# Patient Record
Sex: Female | Born: 1960
Health system: Southern US, Community
[De-identification: ages and names within clinical notes are randomized; demographics above are authoritative.]

## PROBLEM LIST (undated history)

## (undated) DIAGNOSIS — M8580 Other specified disorders of bone density and structure, unspecified site: Secondary | ICD-10-CM

## (undated) DIAGNOSIS — D573 Sickle-cell trait: Secondary | ICD-10-CM

## (undated) DIAGNOSIS — E049 Nontoxic goiter, unspecified: Secondary | ICD-10-CM

## (undated) DIAGNOSIS — I10 Essential (primary) hypertension: Secondary | ICD-10-CM

## (undated) HISTORY — DX: Sickle-cell trait: D57.3

## (undated) HISTORY — DX: Other specified disorders of bone density and structure, unspecified site: M85.80

## (undated) HISTORY — DX: Nontoxic goiter, unspecified: E04.9

## (undated) HISTORY — DX: Essential (primary) hypertension: I10

---

## 1998-09-13 HISTORY — PX: APPENDECTOMY: SHX54

## 2005-07-08 ENCOUNTER — Emergency Department (HOSPITAL_COMMUNITY): Admission: EM | Admit: 2005-07-08 | Discharge: 2005-07-08 | Payer: Self-pay | Admitting: Family Medicine

## 2006-01-28 ENCOUNTER — Other Ambulatory Visit: Admission: RE | Admit: 2006-01-28 | Discharge: 2006-01-28 | Payer: Self-pay | Admitting: Family Medicine

## 2006-03-03 ENCOUNTER — Encounter: Admission: RE | Admit: 2006-03-03 | Discharge: 2006-03-03 | Payer: Self-pay | Admitting: Family Medicine

## 2006-03-03 ENCOUNTER — Other Ambulatory Visit: Admission: RE | Admit: 2006-03-03 | Discharge: 2006-03-03 | Payer: Self-pay | Admitting: Diagnostic Radiology

## 2006-03-03 ENCOUNTER — Encounter (INDEPENDENT_AMBULATORY_CARE_PROVIDER_SITE_OTHER): Payer: Self-pay | Admitting: Specialist

## 2007-01-06 ENCOUNTER — Encounter: Admission: RE | Admit: 2007-01-06 | Discharge: 2007-01-06 | Payer: Self-pay | Admitting: Surgery

## 2007-03-08 ENCOUNTER — Other Ambulatory Visit: Admission: RE | Admit: 2007-03-08 | Discharge: 2007-03-08 | Payer: Self-pay | Admitting: Surgery

## 2007-09-01 ENCOUNTER — Emergency Department (HOSPITAL_COMMUNITY): Admission: EM | Admit: 2007-09-01 | Discharge: 2007-09-01 | Payer: Self-pay | Admitting: Emergency Medicine

## 2007-10-24 ENCOUNTER — Encounter: Admission: RE | Admit: 2007-10-24 | Discharge: 2007-10-24 | Payer: Self-pay | Admitting: Surgery

## 2008-04-15 ENCOUNTER — Ambulatory Visit: Admission: RE | Admit: 2008-04-15 | Discharge: 2008-04-15 | Payer: Self-pay | Admitting: Family Medicine

## 2008-07-30 ENCOUNTER — Other Ambulatory Visit: Admission: RE | Admit: 2008-07-30 | Discharge: 2008-07-30 | Payer: Self-pay | Admitting: Family Medicine

## 2008-12-09 ENCOUNTER — Encounter: Admission: RE | Admit: 2008-12-09 | Discharge: 2008-12-09 | Payer: Self-pay | Admitting: Surgery

## 2010-10-04 ENCOUNTER — Encounter: Payer: Self-pay | Admitting: Surgery

## 2011-05-02 ENCOUNTER — Emergency Department (HOSPITAL_COMMUNITY)
Admission: EM | Admit: 2011-05-02 | Discharge: 2011-05-02 | Disposition: A | Discharge status: Home / Self Care | Payer: BC Managed Care – PPO | Attending: Emergency Medicine | Admitting: Emergency Medicine

## 2011-05-02 DIAGNOSIS — R51 Headache: Secondary | ICD-10-CM | POA: Insufficient documentation

## 2011-05-02 DIAGNOSIS — R42 Dizziness and giddiness: Secondary | ICD-10-CM | POA: Insufficient documentation

## 2011-05-02 DIAGNOSIS — I10 Essential (primary) hypertension: Secondary | ICD-10-CM | POA: Insufficient documentation

## 2011-05-02 LAB — CBC
HCT: 33.6 % — ABNORMAL LOW (ref 36.0–46.0)
Hemoglobin: 11.6 g/dL — ABNORMAL LOW (ref 12.0–15.0)
MCHC: 34.5 g/dL (ref 30.0–36.0)
MCV: 89.4 fL (ref 78.0–100.0)
RBC: 3.76 MIL/uL — ABNORMAL LOW (ref 3.87–5.11)
WBC: 5.1 10*3/uL (ref 4.0–10.5)

## 2011-05-02 LAB — URINALYSIS, ROUTINE W REFLEX MICROSCOPIC
Bilirubin Urine: NEGATIVE
Ketones, ur: NEGATIVE mg/dL
Nitrite: NEGATIVE
Protein, ur: NEGATIVE mg/dL
Specific Gravity, Urine: 1.008 (ref 1.005–1.030)

## 2011-05-02 LAB — COMPREHENSIVE METABOLIC PANEL
ALT: 19 U/L (ref 0–35)
Albumin: 4.2 g/dL (ref 3.5–5.2)
BUN: 12 mg/dL (ref 6–23)
CO2: 30 mEq/L (ref 19–32)
Calcium: 10.6 mg/dL — ABNORMAL HIGH (ref 8.4–10.5)
GFR calc Af Amer: >60 mL/min (ref >60)
Glucose, Bld: 80 mg/dL (ref 70–99)
Total Protein: 7.9 g/dL (ref 6.0–8.3)

## 2011-05-02 LAB — DIFFERENTIAL
Basophils Absolute: 0 10*3/uL (ref 0.0–0.1)
Eosinophils Absolute: 0 10*3/uL (ref 0.0–0.7)
Lymphocytes Relative: 39 % (ref 12–46)
Monocytes Absolute: 0.4 10*3/uL (ref 0.1–1.0)

## 2011-05-02 LAB — URINE MICROSCOPIC-ADD ON

## 2011-06-18 LAB — I-STAT 8, (EC8 V) (CONVERTED LAB)
Glucose, Bld: 109 — ABNORMAL HIGH
HCT: 32 — ABNORMAL LOW
Operator id: 285841
Potassium: 3.8
Sodium: 141
TCO2: 29
pCO2, Ven: 53 — ABNORMAL HIGH

## 2011-06-18 LAB — POCT CARDIAC MARKERS: Myoglobin, poc: 53.2

## 2011-06-18 LAB — POCT I-STAT CREATININE
Creatinine, Ser: 1
Operator id: 285841

## 2011-10-28 ENCOUNTER — Encounter: Payer: Self-pay | Admitting: Cardiovascular Disease

## 2011-10-28 ENCOUNTER — Ambulatory Visit (INDEPENDENT_AMBULATORY_CARE_PROVIDER_SITE_OTHER): Payer: BC Managed Care – PPO | Admitting: Cardiovascular Disease

## 2011-10-28 DIAGNOSIS — R079 Chest pain, unspecified: Secondary | ICD-10-CM | POA: Insufficient documentation

## 2011-10-28 NOTE — Progress Notes (Signed)
   History of Present Illness: 51 yo AAF with history of HTN, osteopenia, anemia who is referred today for cardiac evaluation by Dr. Merri Brunette at [facility] for evaluation of chest pain. She tells me that for 4 months, she feels that she has "pressure on her chest". This happens with exertion. It is described as a pressure. No radiation. There is associated SOB but no nausea/diaphoresis. She denies orthopnea, PND, LE edema.   Primary Care Physician: Dr. Azucena Cecil  [facility])   Past Medical History  Diagnosis Date  . Hypertension   . Osteopenia   . Goiter   . Sickle cell trait     Past Surgical History  Procedure Date  . Appendectomy 2000    Current Outpatient Prescriptions  Medication Sig Dispense Refill  . amLODipine (NORVASC) 2.5 MG tablet Take 2.5 mg by mouth daily.      . hydrochlorothiazide (HYDRODIURIL) 25 MG tablet Take 25 mg by mouth daily.        No Known Allergies  History   Social History  . Marital Status: Married    Spouse Name: N/A    Number of Children: 4  . Years of Education: N/A   Occupational History  . 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 Smoking status: Never Smoker   . Smokeless tobacco: Not on file  . Alcohol Use: No  . Drug Use: No  . Sexually Active: Not on file   Other Topics Concern  . Not on file   Social History Narrative   Married 4 children    Family History  Problem Relation Age of Onset  . Diabetes Mother   . Pulmonary embolism Father     After traumatic injury and fracture    Review of Systems:  As stated in the HPI and otherwise negative.   BP 118/73  Pulse 73  Ht 5\' 6"  (1.676 m)  Wt 164 lb (74.39 kg)  BMI 26.47 kg/m2  Physical Examination: General: Well developed, well nourished, NAD HEENT: OP clear, mucus membranes moist SKIN: warm, dry. No rashes. Neuro: No focal deficits Musculoskeletal: Muscle strength 5/5 all ext Psychiatric: Mood and affect  normal Neck: No JVD, no carotid bruits, no thyromegaly, no lymphadenopathy. Lungs:Clear bilaterally, no wheezes, rhonci, crackles Cardiovascular: Regular rate and rhythm. No murmurs, gallops or rubs. Abdomen:Soft. Bowel sounds present. Non-tender.  Extremities: No lower extremity edema. Pulses are 2 + in the bilateral DP/PT.  EKG: Sinus brady, rate 56 bpm. Otherwise normal ekg.

## 2011-10-28 NOTE — Assessment & Plan Note (Signed)
Her chest pain is exertional. It is relieved with rest. Her risk factor for CAD is HTN. This may be stress related. Does not appear to be GI related. Will arrange exercise stress myoview to exclude ischemia and echocardiogram to assess her LVEF and exclude structural heart disease.

## 2011-10-28 NOTE — Patient Instructions (Signed)
Your physician recommends that you schedule a follow-up appointment in: 3 weeks Your physician has requested that you have an echocardiogram. Echocardiography is a painless test that uses sound waves to create images of your heart. It provides your doctor with information about the size and shape of your heart and how well your heart's chambers and valves are working. This procedure takes approximately one hour. There are no restrictions for this procedure.  Your physician has requested that you have en exercise stress myoview. For further information please visit [email or web address]. Please follow instruction sheet, as given.   

## 2011-11-09 ENCOUNTER — Ambulatory Visit (HOSPITAL_COMMUNITY): Payer: BC Managed Care – PPO | Attending: Cardiovascular Disease | Admitting: Radiology

## 2011-11-09 DIAGNOSIS — R079 Chest pain, unspecified: Secondary | ICD-10-CM

## 2011-11-09 DIAGNOSIS — R002 Palpitations: Secondary | ICD-10-CM | POA: Insufficient documentation

## 2011-11-09 DIAGNOSIS — R0602 Shortness of breath: Secondary | ICD-10-CM | POA: Insufficient documentation

## 2011-11-09 DIAGNOSIS — R000 Tachycardia, unspecified: Secondary | ICD-10-CM | POA: Insufficient documentation

## 2011-11-09 DIAGNOSIS — I10 Essential (primary) hypertension: Secondary | ICD-10-CM | POA: Insufficient documentation

## 2011-11-09 DIAGNOSIS — R0789 Other chest pain: Secondary | ICD-10-CM | POA: Insufficient documentation

## 2011-11-09 MED ORDER — TECHNETIUM TC 99M TETROFOSMIN IV KIT
10.0000 | PACK | Freq: Once | INTRAVENOUS | Status: AC | PRN
  Administered 2011-11-09: 10 via INTRAVENOUS

## 2011-11-09 MED ORDER — TECHNETIUM TC 99M TETROFOSMIN IV KIT
30.0000 | PACK | Freq: Once | INTRAVENOUS | Status: AC | PRN
  Administered 2011-11-09: 30 via INTRAVENOUS

## 2011-11-09 NOTE — Progress Notes (Signed)
[facility] SITE 3 NUCLEAR MED [address] [place] [address] [phone]  Cardiology Nuclear Med Study  Megan Bates is a 51 y.o. female [id number] February 23, 1961   Nuclear Med Background Indication for Stress Test:  Evaluation for Ischemia History:  No previous documented CAD Cardiac Risk Factors: Hypertension  Symptoms:  Chest Pain/Pressure with and without Exertion (last episode of chest discomfort was Friday, 11/05/11), Palpitations, Rapid HR and SOB with Chest Pain   Nuclear Pre-Procedure Caffeine/Decaff Intake:  None> 12 hrs  NPO After: 11:00pm   Lungs:  Clear. IV 0.9% NS with Angio Cath:  20g  IV Site: R Antecubital x 1,tolerated well IV Started by:  Irean Hong, RN  Chest Size (in):  38 Cup Size: C  Height: 5\' 6"  (1.676 m)  Weight:  165 lb (74.844 kg)  BMI:  Body mass index is 26.63 kg/(m^2). Tech Comments:  No medications taken today, per patient.    Nuclear Med Study 1 or 2 day study: 1 day  Stress Test Type:  Stress  Reading MD: Charlton Haws, MD  Order Authorizing Provider:  Verne Carrow, MD  Resting Radionuclide: Technetium 33m Tetrofosmin  Resting Radionuclide Dose: 11.0 mCi   Stress Radionuclide:  Technetium 74m Tetrofosmin  Stress Radionuclide Dose: 33.0 mCi           Stress Protocol Rest HR: 60 Stress HR: 150  Rest BP: Sitting:129/77  Standing:1215/79 Stress BP: 199/92  Exercise Time (min): 8:00 METS: 8.9   Predicted Max HR: 170 bpm % Max HR: 88.24 bpm Rate Pressure Product: 21308   Dose of Adenosine (mg):  n/a Dose of Lexiscan: n/a mg  Dose of Atropine (mg): n/a Dose of Dobutamine: n/a mcg/kg/min (at max HR)  Stress Test Technologist: Smiley Houseman, CMA-N  Nuclear Technologist:  Domenic Polite, CNMT     Rest Procedure:  Myocardial perfusion imaging was performed at rest 45 minutes following the intravenous administration of Technetium 54m Tetrofosmin.  Rest ECG: No acute changes.  Stress Procedure:  The patient  exercised for eight minutes on the treadmill utilizing the Bruce protocol.  The patient stopped due to fatigue and denied any chest pain.  There were no diagnostic ST-T wave changes.  Technetium 107m Tetrofosmin was injected at peak exercise and myocardial perfusion imaging was performed after a brief delay.  Stress ECG: No significant change from baseline ECG and Peak stress ECG with artifact  QPS Raw Data Images:  Normal; no motion artifact; normal heart/lung ratio. Stress Images:  Normal homogeneous uptake in all areas of the myocardium. Rest Images:  Normal homogeneous uptake in all areas of the myocardium. Subtraction (SDS):  Normal Transient Ischemic Dilatation (Normal <1.22):  1.14 Lung/Heart Ratio (Normal <0.45):  0.31  Quantitative Gated Spect Images QGS EDV:  61 ml QGS ESV:  20 ml QGS cine images:  NL LV Function; NL Wall Motion QGS EF: 67%  Impression Exercise Capacity:  Fair exercise capacity. BP Response:  Normal blood pressure response. Clinical Symptoms:  There is dyspnea. ECG Impression:  No obvious ECG changes but lots of artifact on peak stress ECG Comparison with Prior Nuclear Study: No previous nuclear study performed  Overall Impression:  Normal stress nuclear study.    Charlton Haws

## 2011-11-11 ENCOUNTER — Other Ambulatory Visit: Payer: Self-pay

## 2011-11-11 ENCOUNTER — Ambulatory Visit (HOSPITAL_COMMUNITY): Payer: BC Managed Care – PPO | Attending: Cardiology

## 2011-11-11 ENCOUNTER — Other Ambulatory Visit (HOSPITAL_COMMUNITY): Payer: Self-pay | Admitting: Cardiovascular Disease

## 2011-11-11 DIAGNOSIS — R079 Chest pain, unspecified: Secondary | ICD-10-CM | POA: Insufficient documentation

## 2011-11-11 DIAGNOSIS — R072 Precordial pain: Secondary | ICD-10-CM

## 2011-11-11 DIAGNOSIS — R0609 Other forms of dyspnea: Secondary | ICD-10-CM | POA: Insufficient documentation

## 2011-11-11 DIAGNOSIS — R0989 Other specified symptoms and signs involving the circulatory and respiratory systems: Secondary | ICD-10-CM | POA: Insufficient documentation

## 2011-11-30 ENCOUNTER — Ambulatory Visit (INDEPENDENT_AMBULATORY_CARE_PROVIDER_SITE_OTHER): Payer: BC Managed Care – PPO | Admitting: Cardiovascular Disease

## 2011-11-30 ENCOUNTER — Encounter: Payer: Self-pay | Admitting: Cardiovascular Disease

## 2011-11-30 DIAGNOSIS — R079 Chest pain, unspecified: Secondary | ICD-10-CM

## 2011-11-30 NOTE — Patient Instructions (Signed)
Your physician recommends that you schedule a follow-up appointment as needed with Dr. McAlhany   

## 2011-11-30 NOTE — Assessment & Plan Note (Signed)
Her chest pain is resolved. Stress myoview is normal with no evidence of ischemia. Echo normal. This is most likely non-cardiac chest pain. No further cardiac workup is needed at this time.

## 2011-11-30 NOTE — Progress Notes (Signed)
History of Present Illness: 51 yo AAF with history of HTN, osteopenia, anemia who is here today for cardiac follow up. I saw her several weeks ago for  evaluation of chest pain. She told me that for 4 months, she feels that she has "pressure on her chest". This happens with exertion. It is described as a pressure. No radiation. There is associated SOB but no nausea/diaphoresis. She denies orthopnea, PND, LE edema. I arranged a stress myoview and echo at the last visit. Her stress test was normal. Her echo was normal. She is feeling better. Chest pain has resolved. No other complaints.   Primary Care Physician: Dr. Azucena Cecil, [facility])    Past Medical History  Diagnosis Date  . Hypertension   . Osteopenia   . Goiter   . Sickle cell trait     Past Surgical History  Procedure Date  . Appendectomy 2000    Current Outpatient Prescriptions  Medication Sig Dispense Refill  . amLODipine (NORVASC) 2.5 MG tablet Take 2.5 mg by mouth daily.      . hydrochlorothiazide (HYDRODIURIL) 25 MG tablet Take 25 mg by mouth daily.        No Known Allergies  History   Social History  . Marital Status: Married    Spouse Name: N/A    Number of Children: 4  . 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 Smoking status: Never Smoker   . Smokeless tobacco: Not on file  . Alcohol Use: No  . Drug Use: No  . Sexually Active: Not on file   Other Topics Concern  . Not on file   Social History Narrative   Married 4 children    Family History  Problem Relation Age of Onset  . Diabetes Mother   . Pulmonary embolism Father     After traumatic injury and fracture    Review of Systems:  As stated in the HPI and otherwise negative.   BP 116/63  Pulse 61  Ht 5\' 5"  (1.651 m)  Wt 164 lb (74.39 kg)  BMI 27.29 kg/m2  Physical Examination: General: Well developed, well nourished, NAD HEENT: OP clear, mucus  membranes moist SKIN: warm, dry. No rashes. Neuro: No focal deficits Musculoskeletal: Muscle strength 5/5 all ext Psychiatric: Mood and affect normal Neck: No JVD, no carotid bruits, no thyromegaly, no lymphadenopathy. Lungs:Clear bilaterally, no wheezes, rhonci, crackles Cardiovascular: Regular rate and rhythm. No murmurs, gallops or rubs. Abdomen:Soft. Bowel sounds present. Non-tender.  Extremities: No lower extremity edema. Pulses are 2 + in the bilateral DP/PT.  Stress Myoview: 11/09/11: Stress Procedure: The patient exercised for eight minutes on the treadmill utilizing the Bruce protocol. The patient stopped due to fatigue and denied any chest pain. There were no diagnostic ST-T wave changes. Technetium 3m Tetrofosmin was injected at peak exercise and myocardial perfusion imaging was performed after a brief delay.  Stress ECG: No significant change from baseline ECG and Peak stress ECG with artifact  QPS  Raw Data Images: Normal; no motion artifact; normal heart/lung ratio.  Stress Images: Normal homogeneous uptake in all areas of the myocardium.  Rest Images: Normal homogeneous uptake in all areas of the myocardium.  Subtraction (SDS): Normal  Transient Ischemic Dilatation (Normal <1.22): 1.14  Lung/Heart Ratio (Normal <0.45): 0.31  Quantitative Gated Spect Images  QGS EDV: 61 ml  QGS ESV: 20 ml  QGS cine images: NL  LV Function; NL Wall Motion  QGS EF: 67%  Impression  Exercise Capacity: Fair exercise capacity.  BP Response: Normal blood pressure response.  Clinical Symptoms: There is dyspnea.  ECG Impression: No obvious ECG changes but lots of artifact on peak stress ECG  Comparison with Prior Nuclear Study: No previous nuclear study performed  Overall Impression: Normal stress nuclear study.  Echo 11/11/11: Left ventricle: The cavity size was normal. Wall thickness was normal. Systolic function was normal. The estimated ejection fraction was in the range of 60% to 65%.  Wall motion was normal; there were no regional wall motion abnormalities. Features are consistent with a pseudonormal left ventricular filling pattern, with concomitant abnormal relaxation and increased filling pressure (grade 2 diastolic dysfunction). - Aortic valve: There was no stenosis. - Mitral valve: No significant regurgitation. - Left atrium: The atrium was mildly dilated. - Right ventricle: The cavity size was normal. Systolic function was normal. - Tricuspid valve: Peak RV-RA gradient: 24mm Hg (S). - Pulmonary arteries: PA peak pressure: 29mm Hg (S). - Inferior vena cava: The vessel was normal in size; the respirophasic diameter changes were in the normal range (= 50%); findings are consistent with normal central venous pressure. Impressions:  - Normal LV size and systolic function, EF 60-65%. Moderate diastolic dysfunction. Normal RV size and systolic function. No significant valvular dysfunction.

## 2013-06-05 ENCOUNTER — Encounter (HOSPITAL_COMMUNITY): Payer: Self-pay | Admitting: Emergency Medicine

## 2013-06-05 ENCOUNTER — Emergency Department (HOSPITAL_COMMUNITY)
Admission: EM | Admit: 2013-06-05 | Discharge: 2013-06-05 | Disposition: A | Discharge status: Home / Self Care | Payer: No Typology Code available for payment source | Attending: Emergency Medicine | Admitting: Emergency Medicine

## 2013-06-05 ENCOUNTER — Emergency Department (HOSPITAL_COMMUNITY): Payer: No Typology Code available for payment source

## 2013-06-05 DIAGNOSIS — Z862 Personal history of diseases of the blood and blood-forming organs and certain disorders involving the immune mechanism: Secondary | ICD-10-CM | POA: Insufficient documentation

## 2013-06-05 DIAGNOSIS — S39012A Strain of muscle, fascia and tendon of lower back, initial encounter: Secondary | ICD-10-CM

## 2013-06-05 DIAGNOSIS — Z8639 Personal history of other endocrine, nutritional and metabolic disease: Secondary | ICD-10-CM | POA: Insufficient documentation

## 2013-06-05 DIAGNOSIS — Y92410 Unspecified street and highway as the place of occurrence of the external cause: Secondary | ICD-10-CM | POA: Insufficient documentation

## 2013-06-05 DIAGNOSIS — I10 Essential (primary) hypertension: Secondary | ICD-10-CM | POA: Insufficient documentation

## 2013-06-05 DIAGNOSIS — Y9389 Activity, other specified: Secondary | ICD-10-CM | POA: Insufficient documentation

## 2013-06-05 DIAGNOSIS — Z79899 Other long term (current) drug therapy: Secondary | ICD-10-CM | POA: Insufficient documentation

## 2013-06-05 DIAGNOSIS — M545 Low back pain, unspecified: Secondary | ICD-10-CM

## 2013-06-05 DIAGNOSIS — S335XXA Sprain of ligaments of lumbar spine, initial encounter: Secondary | ICD-10-CM | POA: Insufficient documentation

## 2013-06-05 DIAGNOSIS — M549 Dorsalgia, unspecified: Secondary | ICD-10-CM

## 2013-06-05 DIAGNOSIS — Z8739 Personal history of other diseases of the musculoskeletal system and connective tissue: Secondary | ICD-10-CM | POA: Insufficient documentation

## 2013-06-05 MED ORDER — METHOCARBAMOL 500 MG PO TABS
500.0000 mg | ORAL_TABLET | Freq: Once | ORAL | Status: DC
  Filled 2013-06-05: qty 1

## 2013-06-05 MED ORDER — METHOCARBAMOL 750 MG PO TABS: 750.0000 mg | ORAL_TABLET | Freq: Four times a day (QID) | ORAL | Status: DC | PRN

## 2013-06-05 MED ORDER — IBUPROFEN 800 MG PO TABS: 800.0000 mg | ORAL_TABLET | Freq: Three times a day (TID) | ORAL | Status: AC | PRN

## 2013-06-05 MED ORDER — IBUPROFEN 800 MG PO TABS
800.0000 mg | ORAL_TABLET | Freq: Once | ORAL | Status: DC
  Filled 2013-06-05: qty 1
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
## 2013-06-05 NOTE — ED Notes (Signed)
Pt refused medications stating she had not eaten since the previous night.
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
## 2013-06-06 NOTE — ED Provider Notes (Signed)
Medical screening examination/treatment/procedure(s) were performed by non-physician practitioner and as supervising physician I was immediately available for consultation/collaboration.  Courtney F Horton, MD 06/06/13 1411 
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
## 2015-09-22 DIAGNOSIS — Z01419 Encounter for gynecological examination (general) (routine) without abnormal findings: Secondary | ICD-10-CM | POA: Diagnosis not present

## 2015-09-22 DIAGNOSIS — Z1151 Encounter for screening for human papillomavirus (HPV): Secondary | ICD-10-CM | POA: Diagnosis not present

## 2015-09-22 DIAGNOSIS — Z1159 Encounter for screening for other viral diseases: Secondary | ICD-10-CM | POA: Diagnosis not present

## 2015-09-30 MED FILL — AMLODIPINE BESYLATE 2.5 MG: 2.5 | 30 days supply | Qty: 30 | Fill #1

## 2015-09-30 MED FILL — HYDROCHLOROTHIAZIDE 25 MG T: 25 | 30 days supply | Qty: 30 | Fill #1

## 2015-09-30 MED FILL — GAVILYTE-G SOLUTION: 236 | 1 days supply | Qty: 4000 | Fill #0

## 2015-10-22 DIAGNOSIS — Z1211 Encounter for screening for malignant neoplasm of colon: Secondary | ICD-10-CM | POA: Diagnosis not present

## 2015-10-22 DIAGNOSIS — K621 Rectal polyp: Secondary | ICD-10-CM | POA: Diagnosis not present

## 2015-10-22 DIAGNOSIS — K635 Polyp of colon: Secondary | ICD-10-CM | POA: Diagnosis not present

## 2015-10-31 MED FILL — HYDROCHLOROTHIAZIDE 25 MG T: 25 | 90 days supply | Qty: 90 | Fill #0

## 2015-10-31 MED FILL — AMLODIPINE BESYLATE 2.5 MG: 2.5 | 90 days supply | Qty: 90 | Fill #0

## 2015-11-28 MED FILL — TRAVATAN Z 0.004% EYE DROP: 0.004 | 50 days supply | Qty: 5 | Fill #0

## 2015-12-02 DIAGNOSIS — Z6828 Body mass index (BMI) 28.0-28.9, adult: Secondary | ICD-10-CM | POA: Diagnosis not present

## 2015-12-02 DIAGNOSIS — D649 Anemia, unspecified: Secondary | ICD-10-CM | POA: Diagnosis not present

## 2015-12-02 DIAGNOSIS — I10 Essential (primary) hypertension: Secondary | ICD-10-CM | POA: Diagnosis not present

## 2016-01-05 DIAGNOSIS — H524 Presbyopia: Secondary | ICD-10-CM | POA: Diagnosis not present

## 2016-01-05 DIAGNOSIS — H401132 Primary open-angle glaucoma, bilateral, moderate stage: Secondary | ICD-10-CM | POA: Diagnosis not present

## 2016-01-05 DIAGNOSIS — H35033 Hypertensive retinopathy, bilateral: Secondary | ICD-10-CM | POA: Diagnosis not present

## 2016-02-02 MED FILL — TRAVATAN Z 0.004% EYE DROP: 0.004 | 50 days supply | Qty: 5 | Fill #1

## 2016-02-02 MED FILL — AMLODIPINE BESYLATE 2.5 MG: 2.5 | 30 days supply | Qty: 30 | Fill #1

## 2016-02-02 MED FILL — HYDROCHLOROTHIAZIDE 25 MG T: 25 | 30 days supply | Qty: 30 | Fill #1

## 2016-02-19 MED FILL — HYDROCHLOROTHIAZIDE 25 MG T: 25 | 30 days supply | Qty: 30 | Fill #0

## 2016-02-19 MED FILL — AMLODIPINE BESYLATE 2.5 MG: 2.5 | 30 days supply | Qty: 30 | Fill #0

## 2016-03-19 MED FILL — HYDROCHLOROTHIAZIDE 25 MG T: 25 | 30 days supply | Qty: 30 | Fill #1

## 2016-03-19 MED FILL — AMLODIPINE BESYLATE 2.5 MG: 2.5 | 30 days supply | Qty: 30 | Fill #1

## 2016-03-19 MED FILL — TRAVATAN Z 0.004% EYE DROP: 0.004 | 50 days supply | Qty: 5 | Fill #0

## 2016-04-26 MED FILL — AMLODIPINE BESYLATE 2.5 MG: 2.5 | 30 days supply | Qty: 30 | Fill #2

## 2016-04-26 MED FILL — HYDROCHLOROTHIAZIDE 25 MG T: 25 | 30 days supply | Qty: 30 | Fill #2

## 2016-04-30 DIAGNOSIS — R7309 Other abnormal glucose: Secondary | ICD-10-CM | POA: Diagnosis not present

## 2016-04-30 DIAGNOSIS — I10 Essential (primary) hypertension: Secondary | ICD-10-CM | POA: Diagnosis not present

## 2016-04-30 DIAGNOSIS — D649 Anemia, unspecified: Secondary | ICD-10-CM | POA: Diagnosis not present

## 2016-05-04 DIAGNOSIS — H401132 Primary open-angle glaucoma, bilateral, moderate stage: Secondary | ICD-10-CM | POA: Diagnosis not present

## 2016-05-25 MED FILL — AMLODIPINE BESYLATE 2.5 MG: 2.5 | 30 days supply | Qty: 30 | Fill #3

## 2016-05-25 MED FILL — HYDROCHLOROTHIAZIDE 25 MG T: 25 | 30 days supply | Qty: 30 | Fill #3

## 2016-06-24 MED FILL — HYDROCHLOROTHIAZIDE 25 MG T: 25 | 90 days supply | Qty: 90 | Fill #0

## 2016-06-24 MED FILL — AMLODIPINE BESYLATE 2.5 MG: 2.5 | 90 days supply | Qty: 90 | Fill #0

## 2016-08-30 DIAGNOSIS — L03327 Acute lymphangitis of buttock: Secondary | ICD-10-CM | POA: Diagnosis not present

## 2016-08-30 DIAGNOSIS — R7309 Other abnormal glucose: Secondary | ICD-10-CM | POA: Diagnosis not present

## 2016-08-30 DIAGNOSIS — I10 Essential (primary) hypertension: Secondary | ICD-10-CM | POA: Diagnosis not present

## 2016-08-30 MED FILL — CEFUROXIME AXETIL 250 MG TA: 250 | 10 days supply | Qty: 20 | Fill #0

## 2016-09-14 DIAGNOSIS — H401132 Primary open-angle glaucoma, bilateral, moderate stage: Secondary | ICD-10-CM | POA: Diagnosis not present

## 2016-09-27 MED FILL — HYDROCHLOROTHIAZIDE 25 MG T: 25 | 90 days supply | Qty: 90 | Fill #1

## 2016-09-27 MED FILL — AMLODIPINE BESYLATE 2.5 MG: 2.5 | 90 days supply | Qty: 90 | Fill #1

## 2016-10-27 DIAGNOSIS — I10 Essential (primary) hypertension: Secondary | ICD-10-CM | POA: Diagnosis not present

## 2016-12-23 MED FILL — AMLODIPINE BESYLATE 2.5 MG: 2.5 | 30 days supply | Qty: 30 | Fill #2

## 2016-12-23 MED FILL — TRAVATAN Z 0.004% EYE DROP: 0.004 | 30 days supply | Qty: 3 | Fill #0

## 2016-12-23 MED FILL — HYDROCHLOROTHIAZIDE 25 MG T: 25 | 30 days supply | Qty: 30 | Fill #2

## 2016-12-28 DIAGNOSIS — Z1231 Encounter for screening mammogram for malignant neoplasm of breast: Secondary | ICD-10-CM | POA: Diagnosis not present

## 2017-01-19 DIAGNOSIS — H35033 Hypertensive retinopathy, bilateral: Secondary | ICD-10-CM | POA: Diagnosis not present

## 2017-01-27 MED FILL — TRAVATAN Z 0.004% EYE DROP: 0.004 | 30 days supply | Qty: 3 | Fill #1

## 2017-01-27 MED FILL — HYDROCHLOROTHIAZIDE 25 MG T: 25 | 90 days supply | Qty: 90 | Fill #0

## 2017-01-27 MED FILL — AMLODIPINE BESYLATE 2.5 MG: 2.5 | 90 days supply | Qty: 90 | Fill #0

## 2017-02-25 DIAGNOSIS — I10 Essential (primary) hypertension: Secondary | ICD-10-CM | POA: Diagnosis not present

## 2017-02-25 DIAGNOSIS — D644 Congenital dyserythropoietic anemia: Secondary | ICD-10-CM | POA: Diagnosis not present

## 2017-02-25 DIAGNOSIS — D649 Anemia, unspecified: Secondary | ICD-10-CM | POA: Diagnosis not present

## 2017-02-25 DIAGNOSIS — R635 Abnormal weight gain: Secondary | ICD-10-CM | POA: Diagnosis not present

## 2017-02-25 DIAGNOSIS — R7309 Other abnormal glucose: Secondary | ICD-10-CM | POA: Diagnosis not present

## 2017-04-08 MED FILL — TRAVATAN Z 0.004% EYE DROP: 0.004 | 30 days supply | Qty: 3 | Fill #2 | Status: TO

## 2017-04-22 MED FILL — AMLODIPINE BESYLATE 2.5 MG: 2.5 | 90 days supply | Qty: 90 | Fill #1

## 2017-04-22 MED FILL — HYDROCHLOROTHIAZIDE 25 MG T: 25 | 90 days supply | Qty: 90 | Fill #1

## 2017-05-26 MED FILL — TRAVATAN Z 0.004% EYE DROP: 0.004 | 30 days supply | Qty: 3 | Fill #0

## 2017-07-14 MED FILL — AMLODIPINE BESYLATE 2.5 MG: 2.5 | 30 days supply | Qty: 30 | Fill #2

## 2017-07-14 MED FILL — HYDROCHLOROTHIAZIDE 25 MG T: 25 | 30 days supply | Qty: 30 | Fill #2

## 2017-07-14 MED FILL — TRAVATAN Z 0.004% EYE DROP: 0.004 | 30 days supply | Qty: 3 | Fill #0

## 2017-07-26 DIAGNOSIS — I10 Essential (primary) hypertension: Secondary | ICD-10-CM | POA: Diagnosis not present

## 2017-07-26 DIAGNOSIS — R7309 Other abnormal glucose: Secondary | ICD-10-CM | POA: Diagnosis not present

## 2017-07-26 DIAGNOSIS — D649 Anemia, unspecified: Secondary | ICD-10-CM | POA: Diagnosis not present

## 2017-07-26 DIAGNOSIS — R635 Abnormal weight gain: Secondary | ICD-10-CM | POA: Diagnosis not present

## 2017-07-28 DIAGNOSIS — H401132 Primary open-angle glaucoma, bilateral, moderate stage: Secondary | ICD-10-CM | POA: Diagnosis not present

## 2017-07-28 DIAGNOSIS — H00024 Hordeolum internum left upper eyelid: Secondary | ICD-10-CM | POA: Diagnosis not present

## 2017-07-28 MED FILL — CEPHALEXIN 500 MG CAPSULE: 500 | 10 days supply | Qty: 20 | Fill #0

## 2017-07-28 MED FILL — TOBRAMYCIN 0.3% EYE DROPS: 0.3 | 25 days supply | Qty: 5 | Fill #0

## 2017-08-16 DIAGNOSIS — H00024 Hordeolum internum left upper eyelid: Secondary | ICD-10-CM | POA: Diagnosis not present

## 2017-08-19 MED FILL — HYDROCHLOROTHIAZIDE 25 MG T: 25 | 90 days supply | Qty: 90 | Fill #0

## 2017-08-19 MED FILL — TRAVATAN Z 0.004% EYE DROP: 0.004 | 30 days supply | Qty: 3 | Fill #1

## 2017-08-19 MED FILL — AMLODIPINE 2.5 MG TABLET: 2.5 | 90 days supply | Qty: 90 | Fill #0

## 2017-10-06 MED FILL — TRAVATAN Z 0.004% EYE DROP: 0.004 | 30 days supply | Qty: 3 | Fill #2

## 2017-11-02 MED FILL — TRAVATAN Z 0.004% EYE DROP: 0.004 | 30 days supply | Qty: 3 | Fill #3

## 2017-11-07 DIAGNOSIS — H401132 Primary open-angle glaucoma, bilateral, moderate stage: Secondary | ICD-10-CM | POA: Diagnosis not present

## 2017-11-28 MED FILL — HYDROCHLOROTHIAZIDE 25 MG T: 25 | 90 days supply | Qty: 90 | Fill #1

## 2017-11-28 MED FILL — TRAVATAN Z 0.004% EYE DROP: 0.004 | 60 days supply | Qty: 5 | Fill #0

## 2017-11-28 MED FILL — AMLODIPINE 2.5 MG TABLET: 2.5 | 90 days supply | Qty: 90 | Fill #1

## 2018-02-01 MED FILL — TRAVATAN Z 0.004% EYE DROP: 0.004 | 30 days supply | Qty: 3 | Fill #1 | Status: TO

## 2018-02-22 MED FILL — HYDROCHLOROTHIAZIDE 25 MG T: 25 | 30 days supply | Qty: 30 | Fill #2

## 2018-02-22 MED FILL — AMLODIPINE 2.5 MG TABLET: 2.5 | 30 days supply | Qty: 30 | Fill #2

## 2018-02-28 DIAGNOSIS — H401132 Primary open-angle glaucoma, bilateral, moderate stage: Secondary | ICD-10-CM | POA: Diagnosis not present

## 2018-02-28 DIAGNOSIS — H524 Presbyopia: Secondary | ICD-10-CM | POA: Diagnosis not present

## 2018-03-21 MED FILL — HYDROCHLOROTHIAZIDE 25 MG T: 25 | 90 days supply | Qty: 90 | Fill #0

## 2018-03-21 MED FILL — AMLODIPINE BESYLATE 2.5 MG: 2.5 | 90 days supply | Qty: 90 | Fill #0

## 2018-03-21 MED FILL — TRAVATAN Z 0.004% EYE DROP: 0.004 | 30 days supply | Qty: 3 | Fill #0

## 2018-04-28 MED FILL — TRAVATAN Z 0.004% EYE DROP: 0.004 | 30 days supply | Qty: 3 | Fill #0

## 2018-05-23 MED FILL — TRAVATAN Z 0.004% EYE DROP: 0.004 | 30 days supply | Qty: 3 | Fill #1

## 2018-06-19 MED FILL — TRAVATAN Z 0.004% EYE DROP: 0.004 | 30 days supply | Qty: 3 | Fill #2

## 2018-06-22 DIAGNOSIS — R7309 Other abnormal glucose: Secondary | ICD-10-CM | POA: Diagnosis not present

## 2018-06-22 DIAGNOSIS — I10 Essential (primary) hypertension: Secondary | ICD-10-CM | POA: Diagnosis not present

## 2018-06-22 DIAGNOSIS — R635 Abnormal weight gain: Secondary | ICD-10-CM | POA: Diagnosis not present

## 2018-06-22 DIAGNOSIS — Z6831 Body mass index (BMI) 31.0-31.9, adult: Secondary | ICD-10-CM | POA: Diagnosis not present

## 2018-06-22 MED FILL — HYDROCHLOROTHIAZIDE 25 MG T: 25 | 90 days supply | Qty: 90 | Fill #0

## 2018-06-22 MED FILL — AMLODIPINE 2.5 MG TABLET: 2.5 | 90 days supply | Qty: 90 | Fill #0

## 2018-06-30 DIAGNOSIS — H401132 Primary open-angle glaucoma, bilateral, moderate stage: Secondary | ICD-10-CM | POA: Diagnosis not present

## 2018-07-27 MED FILL — TRAVATAN Z 0.004% EYE DROP: 0.004 | 50 days supply | Qty: 5 | Fill #0

## 2018-09-26 MED FILL — HYDROCHLOROTHIAZIDE 25 MG T: 25 | 90 days supply | Qty: 90 | Fill #1

## 2018-09-26 MED FILL — AMLODIPINE 2.5 MG TABLET: 2.5 | 90 days supply | Qty: 90 | Fill #1

## 2018-09-28 MED FILL — TRAVOPROST (BAK FREE) 0.004: 0.004 | 25 days supply | Qty: 3 | Fill #0

## 2018-10-16 DIAGNOSIS — Z6831 Body mass index (BMI) 31.0-31.9, adult: Secondary | ICD-10-CM | POA: Diagnosis not present

## 2018-10-16 DIAGNOSIS — M62838 Other muscle spasm: Secondary | ICD-10-CM | POA: Diagnosis not present

## 2018-10-16 DIAGNOSIS — I10 Essential (primary) hypertension: Secondary | ICD-10-CM | POA: Diagnosis not present

## 2018-10-16 DIAGNOSIS — R7309 Other abnormal glucose: Secondary | ICD-10-CM | POA: Diagnosis not present

## 2018-10-16 MED FILL — ETODOLAC 400 MG TABLET: 400 | 30 days supply | Qty: 60 | Fill #0

## 2018-10-16 MED FILL — CYCLOBENZAPRINE HCL 10 MG T: 10 | 10 days supply | Qty: 30 | Fill #0

## 2018-10-18 DIAGNOSIS — H401132 Primary open-angle glaucoma, bilateral, moderate stage: Secondary | ICD-10-CM | POA: Diagnosis not present

## 2018-10-18 MED FILL — TIMOLOL 0.5% EYE DROPS: 0.5 | 90 days supply | Qty: 10 | Fill #0

## 2018-10-20 DIAGNOSIS — Z1231 Encounter for screening mammogram for malignant neoplasm of breast: Secondary | ICD-10-CM | POA: Diagnosis not present

## 2018-10-20 DIAGNOSIS — M8589 Other specified disorders of bone density and structure, multiple sites: Secondary | ICD-10-CM | POA: Diagnosis not present

## 2018-11-01 MED FILL — TRAVOPROST (BAK FREE) 0.004: 0.004 | 25 days supply | Qty: 3 | Fill #0

## 2018-11-21 DIAGNOSIS — H401113 Primary open-angle glaucoma, right eye, severe stage: Secondary | ICD-10-CM | POA: Diagnosis not present

## 2018-11-21 MED FILL — TRAVOPROST (BAK FREE) 0.004: 0.004 | 25 days supply | Qty: 3 | Fill #0

## 2018-12-15 MED FILL — HYDROCHLOROTHIAZIDE 25 MG T: 25 | 30 days supply | Qty: 30 | Fill #2

## 2018-12-15 MED FILL — TRAVOPROST (BAK FREE) 0.004: 0.004 | 25 days supply | Qty: 3 | Fill #1

## 2018-12-15 MED FILL — AMLODIPINE 2.5 MG TABLET: 2.5 | 30 days supply | Qty: 30 | Fill #2

## 2019-01-12 MED FILL — TIMOLOL 0.5% EYE DROPS: 0.5 | 45 days supply | Qty: 5 | Fill #1

## 2019-01-12 MED FILL — TRAVOPROST (BAK FREE) 0.004: 0.004 | 25 days supply | Qty: 3 | Fill #2

## 2019-01-13 MED FILL — AMLODIPINE 2.5 MG TABLET: 2.5 | 30 days supply | Qty: 30 | Fill #0

## 2019-01-13 MED FILL — HYDROCHLOROTHIAZIDE 25 MG T: 25 | 30 days supply | Qty: 30 | Fill #0

## 2019-01-15 DIAGNOSIS — I10 Essential (primary) hypertension: Secondary | ICD-10-CM | POA: Diagnosis not present

## 2019-02-01 MED FILL — TRAVOPROST (BAK FREE) 0.004: 0.004 | 25 days supply | Qty: 3 | Fill #3

## 2019-02-06 MED FILL — AMLODIPINE 2.5 MG TABLET: 2.5 | 30 days supply | Qty: 30 | Fill #1

## 2019-02-06 MED FILL — HYDROCHLOROTHIAZIDE 25 MG T: 25 | 30 days supply | Qty: 30 | Fill #1

## 2019-03-05 MED FILL — TIMOLOL 0.5% EYE DROPS: 0.5 | 50 days supply | Qty: 5 | Fill #0

## 2019-03-05 MED FILL — TRAVOPROST (BAK FREE) 0.004: 0.004 | 25 days supply | Qty: 3 | Fill #0

## 2019-03-09 MED FILL — HYDROCHLOROTHIAZIDE 25 MG T: 25 | 30 days supply | Qty: 30 | Fill #0

## 2019-03-09 MED FILL — AMLODIPINE 2.5 MG TABLET: 2.5 | 30 days supply | Qty: 30 | Fill #0

## 2019-03-13 DIAGNOSIS — H524 Presbyopia: Secondary | ICD-10-CM | POA: Diagnosis not present

## 2019-03-13 DIAGNOSIS — H401113 Primary open-angle glaucoma, right eye, severe stage: Secondary | ICD-10-CM | POA: Diagnosis not present

## 2019-03-13 DIAGNOSIS — H401122 Primary open-angle glaucoma, left eye, moderate stage: Secondary | ICD-10-CM | POA: Diagnosis not present

## 2019-04-02 MED FILL — HYDROCHLOROTHIAZIDE 25 MG T: 25 | 90 days supply | Qty: 90 | Fill #1

## 2019-04-02 MED FILL — AMLODIPINE 2.5 MG TABLET: 2.5 | 90 days supply | Qty: 90 | Fill #1

## 2019-04-09 MED FILL — TRAVOPROST (BAK FREE) 0.004: 0.004 | 25 days supply | Qty: 3 | Fill #1

## 2019-04-18 MED FILL — TIMOLOL 0.5% EYE DROPS: 0.5 | 50 days supply | Qty: 5 | Fill #1

## 2019-05-15 MED FILL — TRAVOPROST (BAK FREE) 0.004: 0.004 | 25 days supply | Qty: 3 | Fill #2

## 2019-06-08 MED FILL — TIMOLOL 0.5% EYE DROPS: 0.5 | 50 days supply | Qty: 5 | Fill #2

## 2019-06-13 MED FILL — TRAVOPROST (BAK FREE) 0.004: 0.004 | 25 days supply | Qty: 3 | Fill #3

## 2019-07-01 MED FILL — AMLODIPINE 2.5 MG TABLET: 2.5 | 90 days supply | Qty: 90 | Fill #2

## 2019-07-01 MED FILL — HYDROCHLOROTHIAZIDE 25 MG T: 25 | 90 days supply | Qty: 90 | Fill #2

## 2019-07-02 MED FILL — oxyCODONE HCL 5 MG TABS: 5 | 1 days supply | Qty: 3 | Fill #0

## 2019-07-02 MED FILL — IBUPROFEN 600 MG TABLET: 600 | 7 days supply | Qty: 30 | Fill #0

## 2019-07-02 MED FILL — CHLORHEXIDINE 0.12% RINSE: 0.12 | 15 days supply | Qty: 473 | Fill #0

## 2019-07-09 MED FILL — TRAVOPROST (BAK FREE) 0.004: 0.004 | 25 days supply | Qty: 3 | Fill #0

## 2019-07-27 MED FILL — TIMOLOL 0.5% EYE DROPS: 0.5 | 75 days supply | Qty: 10 | Fill #0

## 2019-09-24 DIAGNOSIS — Z20828 Contact with and (suspected) exposure to other viral communicable diseases: Secondary | ICD-10-CM | POA: Diagnosis not present

## 2019-09-24 DIAGNOSIS — Z20822 Contact with and (suspected) exposure to covid-19: Secondary | ICD-10-CM | POA: Diagnosis not present

## 2019-09-24 DIAGNOSIS — Z1152 Encounter for screening for COVID-19: Secondary | ICD-10-CM | POA: Diagnosis not present

## 2019-10-01 DIAGNOSIS — R7309 Other abnormal glucose: Secondary | ICD-10-CM | POA: Diagnosis not present

## 2019-10-01 DIAGNOSIS — I10 Essential (primary) hypertension: Secondary | ICD-10-CM | POA: Diagnosis not present

## 2019-10-01 DIAGNOSIS — D649 Anemia, unspecified: Secondary | ICD-10-CM | POA: Diagnosis not present

## 2019-10-02 MED FILL — HYDROCHLOROTHIAZIDE 25 MG T: 25 | 90 days supply | Qty: 90 | Fill #0

## 2019-10-02 MED FILL — AMLODIPINE 2.5 MG TABLET: 2.5 | 90 days supply | Qty: 90 | Fill #0

## 2019-10-03 MED FILL — POTASSIUM CHLORIDE CRYS ER: 20 | 30 days supply | Qty: 30 | Fill #0

## 2019-11-02 MED FILL — POTASSIUM CHLORIDE CRYS ER: 20 | 30 days supply | Qty: 30 | Fill #1

## 2019-11-27 MED FILL — POTASSIUM CHLORIDE CRYS ER: 20 | 30 days supply | Qty: 30 | Fill #0

## 2019-12-03 MED FILL — TRAVOPROST (BAK FREE) 0.004: 0.004 | 25 days supply | Qty: 3 | Fill #0

## 2019-12-04 DIAGNOSIS — H401113 Primary open-angle glaucoma, right eye, severe stage: Secondary | ICD-10-CM | POA: Diagnosis not present

## 2019-12-04 MED FILL — TIMOLOL 0.5% EYE DROPS: 0.5 | 50 days supply | Qty: 5 | Fill #0

## 2019-12-21 MED FILL — POTASSIUM CHLORIDE CRYS ER: 20 | 30 days supply | Qty: 30 | Fill #1

## 2019-12-24 MED FILL — TRAVOPROST (BAK FREE) 0.004: 0.004 | 25 days supply | Qty: 3 | Fill #0

## 2019-12-25 MED FILL — AMLODIPINE 2.5 MG TABLET: 2.5 | 90 days supply | Qty: 90 | Fill #1

## 2019-12-25 MED FILL — HYDROCHLOROTHIAZIDE 25 MG T: 25 | 90 days supply | Qty: 90 | Fill #1

## 2020-01-14 MED FILL — TRAVOPROST (BAK FREE) 0.004: 0.004 | 25 days supply | Qty: 3 | Fill #1

## 2020-01-17 MED FILL — TIMOLOL MALEATE 0.5 % SOLN: 0.5 | 50 days supply | Qty: 5 | Fill #1

## 2020-01-29 DIAGNOSIS — Z20822 Contact with and (suspected) exposure to covid-19: Secondary | ICD-10-CM | POA: Diagnosis not present

## 2020-01-29 DIAGNOSIS — Z20828 Contact with and (suspected) exposure to other viral communicable diseases: Secondary | ICD-10-CM | POA: Diagnosis not present

## 2020-01-29 DIAGNOSIS — I10 Essential (primary) hypertension: Secondary | ICD-10-CM | POA: Diagnosis not present

## 2020-01-29 DIAGNOSIS — Z03818 Encounter for observation for suspected exposure to other biological agents ruled out: Secondary | ICD-10-CM | POA: Diagnosis not present

## 2020-01-29 DIAGNOSIS — R7309 Other abnormal glucose: Secondary | ICD-10-CM | POA: Diagnosis not present

## 2020-01-29 DIAGNOSIS — E876 Hypokalemia: Secondary | ICD-10-CM | POA: Diagnosis not present

## 2020-01-30 MED FILL — AZITHROMYCIN 500 MG TABS: 500 | 3 days supply | Qty: 4 | Fill #0

## 2020-01-30 MED FILL — MEFLOQUINE HCL 250 MG TAB: 250 | 56 days supply | Qty: 8 | Fill #0

## 2020-02-06 MED FILL — TRAVOPROST (BAK FREE) 0.004: 0.004 | 25 days supply | Qty: 3 | Fill #2

## 2020-02-15 MED FILL — POTASSIUM CHLORIDE CRYS ER: 20 | 30 days supply | Qty: 30 | Fill #1

## 2020-03-02 MED FILL — TRAVOPROST (BAK FREE) 0.004: 0.004 | 25 days supply | Qty: 3 | Fill #3

## 2020-03-07 MED FILL — TIMOLOL MALEATE 0.5 % SOLN: 0.5 | 50 days supply | Qty: 5 | Fill #2

## 2020-03-18 MED FILL — POTASSIUM CHLORIDE CRYS ER: 20 | 30 days supply | Qty: 30 | Fill #0

## 2020-03-19 ENCOUNTER — Other Ambulatory Visit (HOSPITAL_COMMUNITY): Payer: Self-pay | Admitting: Family Medicine

## 2020-03-26 MED FILL — HYDROCHLOROTHIAZIDE 25 MG T: 25 | 90 days supply | Qty: 90 | Fill #0

## 2020-04-01 DIAGNOSIS — H401113 Primary open-angle glaucoma, right eye, severe stage: Secondary | ICD-10-CM | POA: Diagnosis not present

## 2020-04-01 DIAGNOSIS — H401122 Primary open-angle glaucoma, left eye, moderate stage: Secondary | ICD-10-CM | POA: Diagnosis not present

## 2020-04-01 MED FILL — TRAVOPROST (BAK FREE) 0.004: 0.004 | 25 days supply | Qty: 3 | Fill #0

## 2020-04-04 MED FILL — AMLODIPINE 2.5 MG TABLET: 2.5 | 90 days supply | Qty: 90 | Fill #0

## 2020-04-12 MED FILL — POTASSIUM CHLORIDE CRYS ER: 20 | 30 days supply | Qty: 30 | Fill #1

## 2020-04-22 MED FILL — TRAVOPROST (BAK FREE) 0.004: 0.004 | 25 days supply | Qty: 3 | Fill #1

## 2020-04-26 MED FILL — TIMOLOL MALEATE 0.5 % SOLN: 0.5 | 50 days supply | Qty: 5 | Fill #0

## 2020-05-11 MED FILL — POTASSIUM CHLORIDE CRYS ER: 20 | 30 days supply | Qty: 30 | Fill #2

## 2020-05-16 MED FILL — TRAVOPROST (BAK FREE) 0.004: 0.004 | 25 days supply | Qty: 3 | Fill #2

## 2020-06-09 MED FILL — TRAVOPROST (BAK FREE) 0.004: 0.004 | 25 days supply | Qty: 3 | Fill #3

## 2020-06-09 MED FILL — TIMOLOL MALEATE 0.5 % SOLN: 0.5 | 50 days supply | Qty: 5 | Fill #1

## 2020-06-10 MED FILL — POTASSIUM CHLORIDE CRYS ER: 20 | 30 days supply | Qty: 30 | Fill #3

## 2020-06-19 MED FILL — HYDROCHLOROTHIAZIDE 25 MG T: 25 | 90 days supply | Qty: 90 | Fill #0

## 2020-06-27 MED FILL — AMLODIPINE BESYLATE 2.5 MG: 2.5 | 90 days supply | Qty: 90 | Fill #1

## 2020-06-30 ENCOUNTER — Other Ambulatory Visit (HOSPITAL_COMMUNITY): Payer: Self-pay | Admitting: Ophthalmology

## 2020-06-30 DIAGNOSIS — H5203 Hypermetropia, bilateral: Secondary | ICD-10-CM | POA: Diagnosis not present

## 2020-06-30 DIAGNOSIS — H524 Presbyopia: Secondary | ICD-10-CM | POA: Diagnosis not present

## 2020-06-30 DIAGNOSIS — H401113 Primary open-angle glaucoma, right eye, severe stage: Secondary | ICD-10-CM | POA: Diagnosis not present

## 2020-06-30 DIAGNOSIS — H2513 Age-related nuclear cataract, bilateral: Secondary | ICD-10-CM | POA: Diagnosis not present

## 2020-06-30 DIAGNOSIS — H52223 Regular astigmatism, bilateral: Secondary | ICD-10-CM | POA: Diagnosis not present

## 2020-06-30 DIAGNOSIS — H401122 Primary open-angle glaucoma, left eye, moderate stage: Secondary | ICD-10-CM | POA: Diagnosis not present

## 2020-06-30 MED FILL — TRAVOPROST (BAK FREE) 0.004: 0.004 | 25 days supply | Qty: 3 | Fill #0

## 2020-07-10 MED FILL — POTASSIUM CHLORIDE CRYS ER: 20 | 30 days supply | Qty: 30 | Fill #0

## 2020-07-21 MED FILL — TRAVOPROST (BAK FREE) 0.004: 0.004 | 25 days supply | Qty: 3 | Fill #1

## 2020-07-29 MED FILL — TIMOLOL MALEATE 0.5 % SOLN: 0.5 | 50 days supply | Qty: 5 | Fill #2

## 2020-08-06 MED FILL — POTASSIUM CHLORIDE CRYS ER: 20 | 30 days supply | Qty: 30 | Fill #1

## 2020-08-13 MED FILL — TRAVOPROST (BAK FREE) 0.004: 0.004 | 25 days supply | Qty: 3 | Fill #2

## 2020-09-04 DIAGNOSIS — E786 Lipoprotein deficiency: Secondary | ICD-10-CM | POA: Diagnosis not present

## 2020-09-04 DIAGNOSIS — E876 Hypokalemia: Secondary | ICD-10-CM | POA: Diagnosis not present

## 2020-09-04 DIAGNOSIS — R7309 Other abnormal glucose: Secondary | ICD-10-CM | POA: Diagnosis not present

## 2020-09-04 DIAGNOSIS — I10 Essential (primary) hypertension: Secondary | ICD-10-CM | POA: Diagnosis not present

## 2020-09-04 DIAGNOSIS — E6609 Other obesity due to excess calories: Secondary | ICD-10-CM | POA: Diagnosis not present

## 2020-09-07 MED FILL — TRAVOPROST (BAK FREE) 0.004: 0.004 | 25 days supply | Qty: 3 | Fill #3

## 2020-09-08 MED FILL — POTASSIUM CHLORIDE CRYS ER: 20 | 30 days supply | Qty: 30 | Fill #0

## 2020-09-17 MED FILL — TIMOLOL MALEATE 0.5 % SOLN: 0.5 | 90 days supply | Qty: 15 | Fill #0

## 2020-09-18 ENCOUNTER — Other Ambulatory Visit (HOSPITAL_COMMUNITY): Payer: Self-pay | Admitting: Family Medicine

## 2020-09-19 MED FILL — HYDROCHLOROTHIAZIDE 25 MG T: 25 | 90 days supply | Qty: 90 | Fill #0

## 2020-09-26 ENCOUNTER — Other Ambulatory Visit (HOSPITAL_COMMUNITY): Payer: Self-pay | Admitting: Family Medicine

## 2020-09-26 MED FILL — AMLODIPINE BESYLATE 2.5 MG: 2.5 | 90 days supply | Qty: 90 | Fill #0

## 2020-10-02 MED FILL — TRAVOPROST (BAK FREE) 0.004: 0.004 | 25 days supply | Qty: 3 | Fill #4

## 2020-10-07 ENCOUNTER — Other Ambulatory Visit (HOSPITAL_COMMUNITY): Payer: Self-pay | Admitting: Occupational Medicine

## 2020-10-10 MED FILL — AZITHROMYCIN 500 MG TABLET: 500 | 3 days supply | Qty: 4 | Fill #0

## 2020-10-10 MED FILL — ATOVAQUONE-PROGUANIL 250-10: 250-100 | 19 days supply | Qty: 19 | Fill #0

## 2020-10-17 DIAGNOSIS — Z20822 Contact with and (suspected) exposure to covid-19: Secondary | ICD-10-CM | POA: Diagnosis not present

## 2020-10-18 DIAGNOSIS — Z20822 Contact with and (suspected) exposure to covid-19: Secondary | ICD-10-CM | POA: Diagnosis not present

## 2020-10-19 DIAGNOSIS — Z20822 Contact with and (suspected) exposure to covid-19: Secondary | ICD-10-CM | POA: Diagnosis not present

## 2020-10-20 DIAGNOSIS — Z20822 Contact with and (suspected) exposure to covid-19: Secondary | ICD-10-CM | POA: Diagnosis not present

## 2020-10-27 MED FILL — TRAVOPROST (BAK FREE) 0.004: 0.004 | 25 days supply | Qty: 3 | Fill #5

## 2020-11-02 MED FILL — POTASSIUM CHLORIDE CRYS ER: 20 | 30 days supply | Qty: 30 | Fill #2

## 2020-11-21 MED FILL — TRAVOPROST (BAK FREE) 0.004: 0.004 | 25 days supply | Qty: 3 | Fill #6

## 2020-11-25 DIAGNOSIS — D649 Anemia, unspecified: Secondary | ICD-10-CM | POA: Diagnosis not present

## 2020-11-25 DIAGNOSIS — I10 Essential (primary) hypertension: Secondary | ICD-10-CM | POA: Diagnosis not present

## 2020-11-25 DIAGNOSIS — R7303 Prediabetes: Secondary | ICD-10-CM | POA: Diagnosis not present

## 2020-11-25 DIAGNOSIS — E876 Hypokalemia: Secondary | ICD-10-CM | POA: Diagnosis not present

## 2020-11-28 ENCOUNTER — Other Ambulatory Visit (HOSPITAL_COMMUNITY): Payer: Self-pay | Admitting: Family Medicine

## 2020-12-02 ENCOUNTER — Other Ambulatory Visit (HOSPITAL_BASED_OUTPATIENT_CLINIC_OR_DEPARTMENT_OTHER): Payer: Self-pay

## 2020-12-10 MED FILL — TIMOLOL MALEATE 0.5 % SOLN: 0.5 | 90 days supply | Qty: 15 | Fill #1

## 2020-12-11 MED FILL — TRAVOPROST (BAK FREE) 0.004: 0.004 | 25 days supply | Qty: 3 | Fill #7

## 2020-12-16 ENCOUNTER — Other Ambulatory Visit (HOSPITAL_COMMUNITY): Payer: Self-pay | Admitting: Family Medicine

## 2020-12-16 ENCOUNTER — Other Ambulatory Visit (HOSPITAL_COMMUNITY): Payer: Self-pay

## 2020-12-16 MED ORDER — AMLODIPINE BESYLATE 2.5 MG PO TABS
ORAL_TABLET | ORAL | 1 refills | Status: DC
  Filled 2020-12-16: qty 90, 90d supply, fill #0

## 2020-12-16 MED ORDER — AMLODIPINE BESYLATE 2.5 MG PO TABS
ORAL_TABLET | Freq: Every day | ORAL | 0 refills | Status: DC
  Filled 2020-12-16 – 2021-06-17 (×2): qty 90, 90d supply, fill #0

## 2020-12-16 MED ORDER — HYDROCHLOROTHIAZIDE 25 MG PO TABS
ORAL_TABLET | ORAL | 1 refills | Status: DC
  Filled 2020-12-16: qty 90, 90d supply, fill #0

## 2020-12-16 MED FILL — Potassium Chloride Microencapsulated Crys ER Tab 20 mEq: ORAL | 30 days supply | Qty: 60 | Fill #0 | Status: AC

## 2020-12-18 ENCOUNTER — Other Ambulatory Visit (HOSPITAL_COMMUNITY): Payer: Self-pay

## 2020-12-24 ENCOUNTER — Other Ambulatory Visit (HOSPITAL_COMMUNITY): Payer: Self-pay

## 2021-01-19 ENCOUNTER — Other Ambulatory Visit (HOSPITAL_COMMUNITY): Payer: Self-pay

## 2021-01-19 MED ORDER — AMLODIPINE BESYLATE 2.5 MG PO TABS
2.5000 mg | ORAL_TABLET | Freq: Every day | ORAL | 0 refills | Status: DC
  Filled 2021-01-19 – 2021-02-13 (×2): qty 60, 60d supply, fill #0

## 2021-01-19 MED ORDER — HYDROCHLOROTHIAZIDE 25 MG PO TABS
25.0000 mg | ORAL_TABLET | Freq: Every day | ORAL | 0 refills | Status: DC
  Filled 2021-01-19 – 2021-02-13 (×2): qty 60, 60d supply, fill #0

## 2021-01-20 ENCOUNTER — Other Ambulatory Visit (HOSPITAL_COMMUNITY): Payer: Self-pay

## 2021-01-20 MED ORDER — TRAVOPROST (BAK FREE) 0.004 % OP SOLN
OPHTHALMIC | 3 refills | Status: DC
  Filled 2021-01-20: qty 5, 50d supply, fill #0

## 2021-02-13 ENCOUNTER — Other Ambulatory Visit (HOSPITAL_COMMUNITY): Payer: Self-pay

## 2021-03-26 DIAGNOSIS — D649 Anemia, unspecified: Secondary | ICD-10-CM | POA: Diagnosis not present

## 2021-03-26 DIAGNOSIS — I10 Essential (primary) hypertension: Secondary | ICD-10-CM | POA: Diagnosis not present

## 2021-03-26 DIAGNOSIS — R7303 Prediabetes: Secondary | ICD-10-CM | POA: Diagnosis not present

## 2021-03-26 DIAGNOSIS — E786 Lipoprotein deficiency: Secondary | ICD-10-CM | POA: Diagnosis not present

## 2021-03-30 ENCOUNTER — Other Ambulatory Visit (HOSPITAL_COMMUNITY): Payer: Self-pay

## 2021-03-30 DIAGNOSIS — E785 Hyperlipidemia, unspecified: Secondary | ICD-10-CM | POA: Diagnosis not present

## 2021-03-30 MED ORDER — POTASSIUM CHLORIDE CRYS ER 20 MEQ PO TBCR
20.0000 meq | EXTENDED_RELEASE_TABLET | Freq: Two times a day (BID) | ORAL | 1 refills | Status: DC
  Filled 2021-03-30: qty 180, 90d supply, fill #0

## 2021-04-30 DIAGNOSIS — Z23 Encounter for immunization: Secondary | ICD-10-CM | POA: Diagnosis not present

## 2021-06-17 ENCOUNTER — Other Ambulatory Visit (HOSPITAL_COMMUNITY): Payer: Self-pay

## 2021-06-17 MED ORDER — HYDROCHLOROTHIAZIDE 25 MG PO TABS
25.0000 mg | ORAL_TABLET | Freq: Every day | ORAL | 0 refills | Status: DC
  Filled 2021-06-17: qty 60, 60d supply, fill #0

## 2021-06-18 ENCOUNTER — Other Ambulatory Visit (HOSPITAL_COMMUNITY): Payer: Self-pay

## 2021-06-30 ENCOUNTER — Other Ambulatory Visit (HOSPITAL_COMMUNITY): Payer: Self-pay

## 2021-06-30 MED ORDER — AZITHROMYCIN 500 MG PO TABS
ORAL_TABLET | ORAL | 0 refills | Status: DC
  Filled 2021-06-30: qty 4, 3d supply, fill #0

## 2021-06-30 MED ORDER — ATOVAQUONE-PROGUANIL HCL 250-100 MG PO TABS
1.0000 | ORAL_TABLET | Freq: Every day | ORAL | 0 refills | Status: DC
  Filled 2021-06-30: qty 18, 18d supply, fill #0

## 2021-07-17 ENCOUNTER — Other Ambulatory Visit: Payer: Self-pay | Admitting: Internal Medicine

## 2021-07-17 DIAGNOSIS — Z20822 Contact with and (suspected) exposure to covid-19: Secondary | ICD-10-CM | POA: Diagnosis not present

## 2021-07-18 LAB — SARS-COV-2 RNA,(COVID-19) QUALITATIVE NAAT: SARS CoV2 RNA: NOT DETECTED

## 2021-07-28 ENCOUNTER — Other Ambulatory Visit (HOSPITAL_COMMUNITY): Payer: Self-pay

## 2021-07-28 DIAGNOSIS — H524 Presbyopia: Secondary | ICD-10-CM | POA: Diagnosis not present

## 2021-07-28 DIAGNOSIS — H401133 Primary open-angle glaucoma, bilateral, severe stage: Secondary | ICD-10-CM | POA: Diagnosis not present

## 2021-07-28 MED ORDER — TIMOLOL MALEATE 0.5 % OP SOLN
OPHTHALMIC | 1 refills | Status: DC
Start: 2021-07-28 — End: 2022-09-03
  Filled 2021-07-28: qty 5, 30d supply, fill #0
  Filled 2021-09-08: qty 5, 50d supply, fill #1
  Filled 2021-12-03: qty 5, 50d supply, fill #2
  Filled 2022-02-03: qty 5, 50d supply, fill #3

## 2021-07-28 MED ORDER — TRAVOPROST (BAK FREE) 0.004 % OP SOLN
OPHTHALMIC | 1 refills | Status: DC
Start: 2021-07-28 — End: 2021-12-04
  Filled 2021-07-28: qty 5, 50d supply, fill #0
  Filled 2021-09-08: qty 5, 50d supply, fill #1

## 2021-07-29 ENCOUNTER — Other Ambulatory Visit (HOSPITAL_COMMUNITY): Payer: Self-pay

## 2021-08-26 ENCOUNTER — Other Ambulatory Visit (HOSPITAL_COMMUNITY): Payer: Self-pay

## 2021-08-27 ENCOUNTER — Other Ambulatory Visit (HOSPITAL_COMMUNITY): Payer: Self-pay

## 2021-08-28 ENCOUNTER — Other Ambulatory Visit (HOSPITAL_COMMUNITY): Payer: Self-pay

## 2021-08-31 ENCOUNTER — Other Ambulatory Visit (HOSPITAL_COMMUNITY): Payer: Self-pay

## 2021-09-01 ENCOUNTER — Other Ambulatory Visit (HOSPITAL_COMMUNITY): Payer: Self-pay

## 2021-09-01 DIAGNOSIS — E782 Mixed hyperlipidemia: Secondary | ICD-10-CM | POA: Diagnosis not present

## 2021-09-01 DIAGNOSIS — E876 Hypokalemia: Secondary | ICD-10-CM | POA: Diagnosis not present

## 2021-09-01 DIAGNOSIS — D649 Anemia, unspecified: Secondary | ICD-10-CM | POA: Diagnosis not present

## 2021-09-01 DIAGNOSIS — I10 Essential (primary) hypertension: Secondary | ICD-10-CM | POA: Diagnosis not present

## 2021-09-01 DIAGNOSIS — R7303 Prediabetes: Secondary | ICD-10-CM | POA: Diagnosis not present

## 2021-09-01 DIAGNOSIS — Z6824 Body mass index (BMI) 24.0-24.9, adult: Secondary | ICD-10-CM | POA: Diagnosis not present

## 2021-09-01 MED ORDER — AMLODIPINE BESYLATE 2.5 MG PO TABS
ORAL_TABLET | ORAL | 1 refills | Status: DC
  Filled 2021-09-01: qty 90, 90d supply, fill #0
  Filled 2021-12-03: qty 90, 90d supply, fill #1

## 2021-09-03 ENCOUNTER — Other Ambulatory Visit (HOSPITAL_COMMUNITY): Payer: Self-pay

## 2021-09-03 MED ORDER — HYDROCHLOROTHIAZIDE 25 MG PO TABS
25.0000 mg | ORAL_TABLET | Freq: Every day | ORAL | 1 refills | Status: DC
  Filled 2021-09-03: qty 90, 90d supply, fill #0
  Filled 2021-12-03: qty 90, 90d supply, fill #1

## 2021-09-03 MED ORDER — AMLODIPINE BESYLATE 2.5 MG PO TABS: 2.5000 mg | ORAL_TABLET | Freq: Every day | ORAL | 1 refills | Status: DC

## 2021-09-08 ENCOUNTER — Other Ambulatory Visit (HOSPITAL_COMMUNITY): Payer: Self-pay

## 2021-09-09 ENCOUNTER — Other Ambulatory Visit (HOSPITAL_COMMUNITY): Payer: Self-pay

## 2021-10-22 ENCOUNTER — Other Ambulatory Visit (HOSPITAL_COMMUNITY): Payer: Self-pay

## 2021-10-22 DIAGNOSIS — F32A Depression, unspecified: Secondary | ICD-10-CM | POA: Diagnosis not present

## 2021-10-22 DIAGNOSIS — Z6829 Body mass index (BMI) 29.0-29.9, adult: Secondary | ICD-10-CM | POA: Diagnosis not present

## 2021-10-22 DIAGNOSIS — E785 Hyperlipidemia, unspecified: Secondary | ICD-10-CM | POA: Diagnosis not present

## 2021-10-22 DIAGNOSIS — F418 Other specified anxiety disorders: Secondary | ICD-10-CM | POA: Diagnosis not present

## 2021-10-22 DIAGNOSIS — I10 Essential (primary) hypertension: Secondary | ICD-10-CM | POA: Diagnosis not present

## 2021-10-22 DIAGNOSIS — R7303 Prediabetes: Secondary | ICD-10-CM | POA: Diagnosis not present

## 2021-10-22 MED ORDER — CITALOPRAM HYDROBROMIDE 10 MG PO TABS
ORAL_TABLET | ORAL | 0 refills | Status: DC
  Filled 2021-10-22: qty 50, 30d supply, fill #0

## 2021-10-22 MED ORDER — ESZOPICLONE 2 MG PO TABS
ORAL_TABLET | ORAL | 1 refills | Status: DC
  Filled 2021-10-22: qty 15, 15d supply, fill #0

## 2021-11-20 DIAGNOSIS — F418 Other specified anxiety disorders: Secondary | ICD-10-CM | POA: Diagnosis not present

## 2021-11-20 DIAGNOSIS — F4312 Post-traumatic stress disorder, chronic: Secondary | ICD-10-CM | POA: Diagnosis not present

## 2021-12-03 ENCOUNTER — Other Ambulatory Visit (HOSPITAL_COMMUNITY): Payer: Self-pay

## 2021-12-04 ENCOUNTER — Other Ambulatory Visit (HOSPITAL_COMMUNITY): Payer: Self-pay

## 2021-12-04 MED ORDER — TRAVOPROST (BAK FREE) 0.004 % OP SOLN
OPHTHALMIC | 2 refills | Status: DC
  Filled 2021-12-04: qty 5, 50d supply, fill #0
  Filled 2022-02-03: qty 5, 50d supply, fill #1
  Filled 2022-05-10: qty 5, 50d supply, fill #2

## 2021-12-07 ENCOUNTER — Other Ambulatory Visit (HOSPITAL_COMMUNITY): Payer: Self-pay

## 2021-12-10 ENCOUNTER — Other Ambulatory Visit (HOSPITAL_COMMUNITY): Payer: Self-pay

## 2021-12-10 MED ORDER — HYDROCODONE-ACETAMINOPHEN 5-325 MG PO TABS
ORAL_TABLET | ORAL | 0 refills | Status: AC
  Filled 2021-12-10: qty 5, 2d supply, fill #0

## 2021-12-21 ENCOUNTER — Other Ambulatory Visit (HOSPITAL_COMMUNITY): Payer: Self-pay

## 2021-12-21 DIAGNOSIS — F418 Other specified anxiety disorders: Secondary | ICD-10-CM | POA: Diagnosis not present

## 2021-12-21 DIAGNOSIS — R7303 Prediabetes: Secondary | ICD-10-CM | POA: Diagnosis not present

## 2021-12-21 DIAGNOSIS — F4312 Post-traumatic stress disorder, chronic: Secondary | ICD-10-CM | POA: Diagnosis not present

## 2021-12-21 DIAGNOSIS — I10 Essential (primary) hypertension: Secondary | ICD-10-CM | POA: Diagnosis not present

## 2021-12-21 MED ORDER — HYDROCHLOROTHIAZIDE 25 MG PO TABS
ORAL_TABLET | ORAL | 1 refills | Status: DC
  Filled 2021-12-21 – 2022-02-20 (×2): qty 90, 90d supply, fill #0
  Filled 2022-05-10: qty 90, 90d supply, fill #1

## 2022-02-02 DIAGNOSIS — I10 Essential (primary) hypertension: Secondary | ICD-10-CM | POA: Diagnosis not present

## 2022-02-02 DIAGNOSIS — F4312 Post-traumatic stress disorder, chronic: Secondary | ICD-10-CM | POA: Diagnosis not present

## 2022-02-02 DIAGNOSIS — E785 Hyperlipidemia, unspecified: Secondary | ICD-10-CM | POA: Diagnosis not present

## 2022-02-03 ENCOUNTER — Other Ambulatory Visit (HOSPITAL_COMMUNITY): Payer: Self-pay

## 2022-02-04 ENCOUNTER — Other Ambulatory Visit (HOSPITAL_COMMUNITY): Payer: Self-pay

## 2022-02-05 ENCOUNTER — Other Ambulatory Visit (HOSPITAL_COMMUNITY): Payer: Self-pay

## 2022-02-20 ENCOUNTER — Other Ambulatory Visit (HOSPITAL_COMMUNITY): Payer: Self-pay

## 2022-02-22 ENCOUNTER — Other Ambulatory Visit (HOSPITAL_COMMUNITY): Payer: Self-pay

## 2022-02-22 MED ORDER — AMLODIPINE BESYLATE 2.5 MG PO TABS
ORAL_TABLET | ORAL | 1 refills | Status: DC
  Filled 2022-02-22: qty 90, 90d supply, fill #0
  Filled 2022-05-10 – 2022-05-18 (×2): qty 90, 90d supply, fill #1

## 2022-02-23 ENCOUNTER — Other Ambulatory Visit (HOSPITAL_COMMUNITY): Payer: Self-pay

## 2022-03-22 ENCOUNTER — Other Ambulatory Visit (HOSPITAL_COMMUNITY): Payer: Self-pay

## 2022-03-22 MED ORDER — AMLODIPINE BESYLATE 2.5 MG PO TABS
ORAL_TABLET | ORAL | 1 refills | Status: DC
  Filled 2022-03-22: qty 90, 90d supply, fill #0

## 2022-03-22 MED ORDER — HYDROCHLOROTHIAZIDE 25 MG PO TABS
ORAL_TABLET | ORAL | 1 refills | Status: AC
  Filled 2022-03-22: qty 90, 90d supply, fill #0

## 2022-05-10 ENCOUNTER — Other Ambulatory Visit (HOSPITAL_COMMUNITY): Payer: Self-pay

## 2022-05-18 ENCOUNTER — Other Ambulatory Visit (HOSPITAL_COMMUNITY): Payer: Self-pay

## 2022-07-08 ENCOUNTER — Other Ambulatory Visit (HOSPITAL_COMMUNITY): Payer: Self-pay

## 2022-07-08 DIAGNOSIS — J101 Influenza due to other identified influenza virus with other respiratory manifestations: Secondary | ICD-10-CM | POA: Diagnosis not present

## 2022-07-08 DIAGNOSIS — Z20822 Contact with and (suspected) exposure to covid-19: Secondary | ICD-10-CM | POA: Diagnosis not present

## 2022-07-08 MED ORDER — OSELTAMIVIR PHOSPHATE 75 MG PO CAPS
75.0000 mg | ORAL_CAPSULE | Freq: Two times a day (BID) | ORAL | 0 refills | Status: AC
  Filled 2022-07-08: qty 10, 5d supply, fill #0

## 2022-07-22 ENCOUNTER — Other Ambulatory Visit (HOSPITAL_COMMUNITY): Payer: Self-pay

## 2022-07-22 DIAGNOSIS — E785 Hyperlipidemia, unspecified: Secondary | ICD-10-CM | POA: Diagnosis not present

## 2022-07-22 DIAGNOSIS — R635 Abnormal weight gain: Secondary | ICD-10-CM | POA: Diagnosis not present

## 2022-07-22 DIAGNOSIS — F32A Depression, unspecified: Secondary | ICD-10-CM | POA: Diagnosis not present

## 2022-07-22 DIAGNOSIS — I10 Essential (primary) hypertension: Secondary | ICD-10-CM | POA: Diagnosis not present

## 2022-07-22 MED ORDER — AZITHROMYCIN 500 MG PO TABS
ORAL_TABLET | ORAL | 0 refills | Status: DC
  Filled 2022-07-22: qty 4, 3d supply, fill #0

## 2022-07-22 MED ORDER — ATOVAQUONE-PROGUANIL HCL 250-100 MG PO TABS
ORAL_TABLET | ORAL | 0 refills | Status: DC
  Filled 2022-07-22: qty 20, 20d supply, fill #0

## 2022-07-22 MED ORDER — HYDROCHLOROTHIAZIDE 25 MG PO TABS: 25.0000 mg | ORAL_TABLET | Freq: Every day | ORAL | 1 refills | Status: DC

## 2022-07-22 MED ORDER — AMLODIPINE BESYLATE 2.5 MG PO TABS
2.5000 mg | ORAL_TABLET | Freq: Every day | ORAL | 1 refills | Status: DC
  Filled 2022-07-22: qty 90, 90d supply, fill #0

## 2022-08-16 ENCOUNTER — Other Ambulatory Visit (HOSPITAL_COMMUNITY): Payer: Self-pay

## 2022-08-16 MED ORDER — AMLODIPINE BESYLATE 2.5 MG PO TABS
2.5000 mg | ORAL_TABLET | Freq: Every day | ORAL | 1 refills | Status: DC
  Filled 2022-08-16: qty 90, 90d supply, fill #0
  Filled 2022-11-06: qty 90, 90d supply, fill #1

## 2022-08-16 MED ORDER — HYDROCHLOROTHIAZIDE 25 MG PO TABS
25.0000 mg | ORAL_TABLET | Freq: Every day | ORAL | 1 refills | Status: DC
  Filled 2022-08-16: qty 90, 90d supply, fill #0
  Filled 2022-11-06: qty 90, 90d supply, fill #1

## 2022-08-17 ENCOUNTER — Other Ambulatory Visit (HOSPITAL_COMMUNITY): Payer: Self-pay

## 2022-09-03 ENCOUNTER — Other Ambulatory Visit (HOSPITAL_COMMUNITY): Payer: Self-pay

## 2022-09-03 MED ORDER — TRAVOPROST (BAK FREE) 0.004 % OP SOLN
OPHTHALMIC | 2 refills | Status: DC
  Filled 2022-09-03: qty 5, 60d supply, fill #0
  Filled 2022-11-06: qty 5, 60d supply, fill #1

## 2022-09-03 MED ORDER — TIMOLOL MALEATE 0.5 % OP SOLN
OPHTHALMIC | 1 refills | Status: AC
  Filled 2022-09-03: qty 15, 30d supply, fill #0
  Filled 2022-11-06: qty 15, 30d supply, fill #1

## 2022-09-03 MED ORDER — TRAVOPROST (BAK FREE) 0.004 % OP SOLN: 1.0000 [drp] | Freq: Every evening | OPHTHALMIC | 3 refills | Status: DC

## 2022-09-09 ENCOUNTER — Emergency Department (HOSPITAL_BASED_OUTPATIENT_CLINIC_OR_DEPARTMENT_OTHER): Payer: 59 | Admitting: Radiology

## 2022-09-09 ENCOUNTER — Other Ambulatory Visit: Payer: Self-pay

## 2022-09-09 ENCOUNTER — Encounter (HOSPITAL_BASED_OUTPATIENT_CLINIC_OR_DEPARTMENT_OTHER): Payer: Self-pay | Admitting: Emergency Medicine

## 2022-09-09 DIAGNOSIS — I10 Essential (primary) hypertension: Secondary | ICD-10-CM | POA: Insufficient documentation

## 2022-09-09 DIAGNOSIS — I491 Atrial premature depolarization: Secondary | ICD-10-CM | POA: Insufficient documentation

## 2022-09-09 DIAGNOSIS — R002 Palpitations: Secondary | ICD-10-CM | POA: Diagnosis not present

## 2022-09-09 DIAGNOSIS — Z79899 Other long term (current) drug therapy: Secondary | ICD-10-CM | POA: Diagnosis not present

## 2022-09-09 DIAGNOSIS — R0602 Shortness of breath: Secondary | ICD-10-CM | POA: Diagnosis not present

## 2022-09-09 DIAGNOSIS — I479 Paroxysmal tachycardia, unspecified: Secondary | ICD-10-CM | POA: Insufficient documentation

## 2022-09-09 LAB — CBC
HCT: 34.2 % — ABNORMAL LOW (ref 36.0–46.0)
Hemoglobin: 11.9 g/dL — ABNORMAL LOW (ref 12.0–15.0)
MCH: 31.5 pg (ref 26.0–34.0)
MCHC: 34.8 g/dL (ref 30.0–36.0)
MCV: 90.5 fL (ref 80.0–100.0)
Platelets: 199 10*3/uL (ref 150–400)
RBC: 3.78 MIL/uL — ABNORMAL LOW (ref 3.87–5.11)
RDW: 13.4 % (ref 11.5–15.5)
WBC: 6.2 10*3/uL (ref 4.0–10.5)
nRBC: 0 % (ref 0.0–0.2)

## 2022-09-09 LAB — BASIC METABOLIC PANEL
Anion gap: 9 (ref 5–15)
BUN: 15 mg/dL (ref 8–23)
CO2: 30 mmol/L (ref 22–32)
Calcium: 10.5 mg/dL — ABNORMAL HIGH (ref 8.9–10.3)
Chloride: 101 mmol/L (ref 98–111)
Creatinine, Ser: 0.99 mg/dL (ref 0.44–1.00)
GFR, Estimated: >60 mL/min (ref >60)
Glucose, Bld: 111 mg/dL — ABNORMAL HIGH (ref 70–99)
Potassium: 3.2 mmol/L — ABNORMAL LOW (ref 3.5–5.1)
Sodium: 140 mmol/L (ref 135–145)

## 2022-09-09 NOTE — ED Triage Notes (Signed)
Sitting watching TV, felt heart racing. HR 150s per home spo2 reader. Denies sob of chest pains with it

## 2022-09-10 ENCOUNTER — Emergency Department (HOSPITAL_BASED_OUTPATIENT_CLINIC_OR_DEPARTMENT_OTHER)
Admission: EM | Admit: 2022-09-10 | Discharge: 2022-09-10 | Disposition: A | Discharge status: Home / Self Care | Payer: 59 | Attending: Emergency Medicine | Admitting: Emergency Medicine

## 2022-09-10 DIAGNOSIS — I491 Atrial premature depolarization: Secondary | ICD-10-CM | POA: Diagnosis not present

## 2022-09-10 DIAGNOSIS — Z79899 Other long term (current) drug therapy: Secondary | ICD-10-CM | POA: Diagnosis not present

## 2022-09-10 DIAGNOSIS — I10 Essential (primary) hypertension: Secondary | ICD-10-CM | POA: Diagnosis not present

## 2022-09-10 DIAGNOSIS — I479 Paroxysmal tachycardia, unspecified: Secondary | ICD-10-CM

## 2022-09-10 MED ORDER — POTASSIUM CHLORIDE CRYS ER 20 MEQ PO TBCR
40.0000 meq | EXTENDED_RELEASE_TABLET | Freq: Once | ORAL | Status: AC
  Administered 2022-09-10: 40 meq via ORAL
  Filled 2022-09-10: qty 2

## 2022-09-10 NOTE — ED Provider Notes (Signed)
[facility] Provider Note: Lowella Dell, MD, FACEP  CSN: [id number] MRN: [id number] ARRIVAL: 09/09/22 at 2158 ROOM: DB016/DB016   CHIEF COMPLAINT  Palpitations   HISTORY OF PRESENT ILLNESS  09/10/22 4:37 AM Megan Bates is a 61 y.o. female who was watching TV television yesterday evening and felt her heart racing.  Her home pulse oximeter indicated her heart rate was in the 150s.  She did not have any chest pain or shortness of breath with it.  She estimates it lasted about 10 minutes.    Past Medical History:  Diagnosis Date   Goiter    Hypertension    Osteopenia    Sickle cell trait (HCC)     Past Surgical History:  Procedure Laterality Date   APPENDECTOMY  2000    Family History  Problem Relation Age of Onset   Diabetes Mother    Pulmonary embolism Father        After traumatic injury and fracture    Social History   Tobacco Use   Smoking status: Never  Substance Use Topics   Alcohol use: No   Drug use: No    Prior to Admission medications   Medication Sig Start Date End Date Taking? Authorizing Provider  amLODipine (NORVASC) 2.5 MG tablet Take 1 tablet (2.5 mg total) by mouth daily. 08/16/22     atovaquone-proguanil (MALARONE) 250-100 MG TABS tablet Take 1 tablet by mouth daily, begin 2 days prearrival, take daily during stay and continue for 7 days post travel for malaria prevention 07/22/22     azithromycin (ZITHROMAX) 500 MG tablet For nonbloody diarrhea, take 2 tabs on day 1, if resolved stop taking. If diarrhea persists take 1 tab on day 2 & 3. For bloody diarrhea, take 2 tablets on day 1 and 1 tablet onday 2 and 3 07/22/22     citalopram (CELEXA) 10 MG tablet Take 1 tablet by mouth every morning for 10 days, then increase to 2 tablets every morning. 10/22/21     eszopiclone (LUNESTA) 2 MG TABS tablet Take 1 tablet by mouth at bedtime as needed for sleep. 10/22/21     hydrochlorothiazide (HYDRODIURIL) 25 MG tablet TAKE 1 TABLET BY MOUTH ONCE DAILY  FOR BLOOD PRESSURE 09/18/20 09/18/21  Renaye Rakers, MD  hydrochlorothiazide (HYDRODIURIL) 25 MG tablet Take 1 tablet by mouth once daily for blood pressure 03/22/22     hydrochlorothiazide (HYDRODIURIL) 25 MG tablet Take 1 tablet (25 mg total) by mouth daily for blood pressure. 08/16/22     HYDROcodone-acetaminophen (NORCO/VICODIN) 5-325 MG tablet TAKE 1 TO 2 TABLETS EVERY 4 TO 6 HOURS AS NEEDED FOR PAIN. 12/10/21     ibuprofen (ADVIL,MOTRIN) 800 MG tablet Take 1 tablet (800 mg total) by mouth every 8 (eight) hours as needed for pain. 06/05/13   Muthersbaugh, Dahlia Client, PA-C  methocarbamol (ROBAXIN) 750 MG tablet Take 1 tablet (750 mg total) by mouth 4 (four) times daily as needed (Take 1 tablet every 6 hours as needed for muscle spasms.). 06/05/13   Muthersbaugh, Dahlia Client, PA-C  potassium chloride SA (KLOR-CON) 20 MEQ tablet Take 1 tablet (20 mEq total) by mouth 2 (two) times daily. 03/30/21     timolol (TIMOPTIC) 0.5 % ophthalmic solution Instill 1 drop into each eye twice daily. 09/03/22     Travoprost, BAK Free, (TRAVATAN Z) 0.004 % SOLN ophthalmic solution Instill 1 drop into each eye once daily in the evening 09/03/22       Allergies Patient has no known allergies.  REVIEW OF SYSTEMS  Negative except as noted here or in the History of Present Illness.   PHYSICAL EXAMINATION  Initial Vital Signs Blood pressure (!) 145/89, pulse 71, temperature 98.3 F (36.8 C), temperature source Oral, resp. rate 18, SpO2 98 %.  Examination General: Well-developed, well-nourished female in no acute distress; appearance consistent with age of record HENT: normocephalic; atraumatic Eyes: Normal appearance Neck: supple Heart: Normal sinus rhythm with frequent PACs Lungs: clear to auscultation bilaterally Abdomen: soft; nondistended; nontender; bowel sounds present Extremities: No deformity; full range of motion; pulses normal Neurologic: Awake, alert and oriented; motor function intact in all extremities and  symmetric; no facial droop Skin: Warm and dry Psychiatric: Normal mood and affect   RESULTS  Summary of this visit's results, reviewed and interpreted by myself:   EKG Interpretation  Date/Time:  Thursday September 09 2022 22:10:25 EST Ventricular Rate:  98 PR Interval:  162 QRS Duration: 74 QT Interval:  322 QTC Calculation: 411 R Axis:   80 Text Interpretation: Sinus rhythm with Premature atrial complexes Right atrial enlargement Nonspecific ST abnormality Abnormal ECG Rate is faster Confirmed by Quyen Cutsforth [phone]) on 09/09/2022 11:19:20 PM       Laboratory Studies: Results for orders placed or performed during the hospital encounter of 09/10/22 (from the past 24 hour(s))  Basic metabolic panel     Status: Abnormal   Collection Time: 09/09/22 10:21 PM  Result Value Ref Range   Sodium 140 135 - 145 mmol/L   Potassium 3.2 (L) 3.5 - 5.1 mmol/L   Chloride 101 98 - 111 mmol/L   CO2 30 22 - 32 mmol/L   Glucose, Bld 111 (H) 70 - 99 mg/dL   BUN 15 8 - 23 mg/dL   Creatinine, Ser 0.99 0.44 - 1.00 mg/dL   Calcium 10.5 (H) 8.9 - 10.3 mg/dL   GFR, Estimated >60 >60 mL/min   Anion gap 9 5 - 15  CBC     Status: Abnormal   Collection Time: 09/09/22 10:21 PM  Result Value Ref Range   WBC 6.2 4.0 - 10.5 K/uL   RBC 3.78 (L) 3.87 - 5.11 MIL/uL   Hemoglobin 11.9 (L) 12.0 - 15.0 g/dL   HCT 34.2 (L) 36.0 - 46.0 %   MCV 90.5 80.0 - 100.0 fL   MCH 31.5 26.0 - 34.0 pg   MCHC 34.8 30.0 - 36.0 g/dL   RDW 13.4 11.5 - 15.5 %   Platelets 199 150 - 400 K/uL   nRBC 0.0 0.0 - 0.2 %   Imaging Studies: DG Chest 2 View  Result Date: 09/09/2022 CLINICAL DATA:  Palpitations and shortness of breath. EXAM: CHEST - 2 VIEW COMPARISON:  None Available. FINDINGS: The heart size and mediastinal contours are within normal limits. Both lungs are clear. Bilateral glenohumeral osteoarthritis. Mild thoracic spondylosis. IMPRESSION: No active cardiopulmonary disease. Electronically Signed   By: Keane Police  D.O.   On: 09/09/2022 22:24    ED COURSE and MDM  Nursing notes, initial and subsequent vitals signs, including pulse oximetry, reviewed and interpreted by myself.  Vitals:   09/09/22 2204 09/10/22 0315 09/10/22 0327  BP: (!) 168/95 (!) 145/89 (!) 145/89  Pulse: (!) 111 71 71  Resp: 18 18 18   Temp: 98.4 F (36.9 C)  98.3 F (36.8 C)  TempSrc:   Oral  SpO2: 100% 99% 98%   Medications  potassium chloride SA (KLOR-CON M) CR tablet 40 mEq (40 mEq Oral Given 09/10/22 0449)    The  patient's rhythm strip was observed while she was in the ED and it showed frequent PACs but no SVT or atrial fibrillation.  On initial arrival she was tachycardic at 111 and sinus tachycardia frequently follows an episode of supraventricular tachycardia.  If she did have supraventricular tachycardia we were not able to witness that.  We will refer her to cardiology for further evaluation.  PROCEDURES  Procedures   ED DIAGNOSES     ICD-10-CM   1. Tachycardia, paroxysmal ([place])  I47.9 Ambulatory referral to Cardiology    2. Premature atrial contractions  I49.1 Ambulatory referral to Cardiology         Shanon Rosser, MD 09/10/22 [phone]

## 2022-09-14 ENCOUNTER — Ambulatory Visit: Payer: Commercial Managed Care - PPO | Attending: Internal Medicine | Admitting: Internal Medicine

## 2022-09-14 ENCOUNTER — Other Ambulatory Visit (HOSPITAL_COMMUNITY): Payer: Self-pay

## 2022-09-14 ENCOUNTER — Encounter: Payer: Self-pay | Admitting: Internal Medicine

## 2022-09-14 ENCOUNTER — Ambulatory Visit (INDEPENDENT_AMBULATORY_CARE_PROVIDER_SITE_OTHER): Payer: Commercial Managed Care - PPO

## 2022-09-14 VITALS — BP 134/83 | HR 57 | Ht 66.0 in | Wt 191.0 lb

## 2022-09-14 DIAGNOSIS — R002 Palpitations: Secondary | ICD-10-CM

## 2022-09-14 MED ORDER — POTASSIUM CHLORIDE CRYS ER 20 MEQ PO TBCR
20.0000 meq | EXTENDED_RELEASE_TABLET | Freq: Two times a day (BID) | ORAL | 1 refills | Status: AC
  Filled 2022-09-14: qty 180, 90d supply, fill #0

## 2022-09-14 NOTE — Progress Notes (Unsigned)
Enrolled patient for a 14 day Zio XT  monitor to be mailed to patients home  °

## 2022-09-14 NOTE — Patient Instructions (Signed)
Medication Instructions:  No Changes In Medications at this time.  *If you need a refill on your cardiac medications before your next appointment, please call your pharmacy*  Lab Work: None Ordered At This Time.  If you have labs (blood work) drawn today and your tests are completely normal, you will receive your results only by: [place] (if you have MyChart) OR A paper copy in the mail If you have any lab test that is abnormal or we need to change your treatment, we will call you to review the results.  Testing/Procedures:  Bryn Gulling- Long Term Monitor Instructions   Your physician has requested you wear your ZIO patch monitor__14_____days.   This is a single patch monitor.  Ir[employer] supplies one patch monitor per enrollment.  Additional stickers are not available.   Please do not apply patch if you will be having a Nuclear Stress Test, Echocardiogram, Cardiac CT, MRI, or Chest Xray during the time frame you would be wearing the monitor. The patch cannot be worn during these tests.  You cannot remove and re-apply the ZIO XT patch monitor.   Your ZIO patch monitor will be sent USPS Priority mail from [facility] directly to your home address. The monitor may also be mailed to a PO BOX if home delivery is not available.   It may take 3-5 days to receive your monitor after you have been enrolled.   Once you have received you monitor, please review enclosed instructions.  Your monitor has already been registered assigning a specific monitor serial # to you.   Applying the monitor   Shave hair from upper left chest.   Hold abrader disc by orange tab.  Rub abrader in 40 strokes over left upper chest as indicated in your monitor instructions.   Clean area with 4 enclosed alcohol pads .  Use all pads to assure are is cleaned thoroughly.  Let dry.   Apply patch as indicated in monitor instructions.  Patch will be place under collarbone on left side of chest with arrow pointing  upward.   Rub patch adhesive wings for 2 minutes.Remove white label marked "1".  Remove white label marked "2".  Rub patch adhesive wings for 2 additional minutes.   While looking in a mirror, press and release button in center of patch.  A small green light will flash 3-4 times .  This will be your only indicator the monitor has been turned on.     Do not shower for the first 24 hours.  You may shower after the first 24 hours.   Press button if you feel a symptom. You will hear a small click.  Record Date, Time and Symptom in the Patient Log Book.   When you are ready to remove patch, follow instructions on last 2 pages of Patient Log Book.  Stick patch monitor onto last page of Patient Log Book.   Place Patient Log Book in [place] box.  Use locking tab on box and tape box closed securely.  The Orange and [employer] has [employer] on it.  Please place in mailbox as soon as possible.  Your physician should have your test results approximately 7 days after the monitor has been mailed back to [facility].   Call [place] at [phone] if you have questions regarding your ZIO XT patch monitor.  Call them immediately if you see an orange light blinking on your monitor.   If your monitor falls off in less  than 4 days contact our Monitor department at [phone].  If your monitor becomes loose or falls off after 4 days call [employer] at [phone] for suggestions on securing your monitor.   Follow-Up: At [facility], you and your health needs are our priority.  As part of our continuing mission to provide you with exceptional heart care, we have created designated Provider Care Teams.  These Care Teams include your primary Cardiologist (physician) and Advanced Practice Providers (APPs -  Physician Assistants and Nurse Practitioners) who all work together to provide you with the care you need, when you need it.  We recommend signing up for the patient portal  called "MyChart".  Sign up information is provided on this After Visit Summary.  MyChart is used to connect with patients for Virtual Visits (Telemedicine).  Patients are able to view lab/test results, encounter notes, upcoming appointments, etc.  Non-urgent messages can be sent to your provider as well.   To learn more about what you can do with MyChart, go to [email or web address].    Your next appointment:   AS NEEDED   The format for your next appointment:   In Person  Provider:   Janina Mayo, MD

## 2022-09-14 NOTE — Progress Notes (Signed)
Cardiology Office Note:    Date:  09/14/2022   ID:  Megan Bates, DOB [birth year], MRN [id number]  PCP:  Renaye Rakers, MD   [facility] Providers Cardiologist:  Maisie Fus, MD     Referring MD: Renaye Rakers, MD   No chief complaint on file. Palpitations   History of Present Illness:    Megan Bates is a 62 y.o. female with a hx of HTN, referral from the ED for palpitations.She noted while watching TV she felt heart racing up to the 150s. Notes stress is contributing. Had sinus rhythm with PACs in the ED.   She has no family hx of SCD. Blood pressure is good today . She saw Dr. Sanjuana Kava for chest pain in 2013. Had myoview in 2013 . It was normal. She had an echo as well and it was normal.  Past Medical History:  Diagnosis Date   Goiter    Hypertension    Osteopenia    Sickle cell trait (HCC)     Past Surgical History:  Procedure Laterality Date   APPENDECTOMY  2000    Current Medications: Current Meds  Medication Sig   amLODipine (NORVASC) 2.5 MG tablet Take 1 tablet (2.5 mg total) by mouth daily.   hydrochlorothiazide (HYDRODIURIL) 25 MG tablet TAKE 1 TABLET BY MOUTH ONCE DAILY FOR BLOOD PRESSURE   timolol (TIMOPTIC) 0.5 % ophthalmic solution Instill 1 drop into each eye twice daily.   Travoprost, BAK Free, (TRAVATAN Z) 0.004 % SOLN ophthalmic solution Instill 1 drop into each eye once daily in the evening     Allergies:   Patient has no known allergies.   Social History   Socioeconomic History   Marital status: Married    Spouse name: Not on file   Number of children: 4   Years of education: Not on file   Highest education level: Not on file  Occupational History   Occupation: Engineer, civil (consulting) at [employer] [address]    Employer: [employer]  Tobacco Use   Smoking status: Never   Smokeless tobacco: Not on file  Substance and Sexual Activity   Alcohol use: No   Drug use: No   Sexual activity: Not on file  Other Topics Concern   Not on file  Social  History Narrative   Married 4 children   Social Determinants of Health   Financial Resource Strain: Not on file  Food Insecurity: Not on file  Transportation Needs: Not on file  Physical Activity: Not on file  Stress: Not on file  Social Connections: Not on file     Family History: The patient's family history includes Diabetes in her mother; Pulmonary embolism in her father. Has a son with cardiomyopathy seen at [facility].  ROS:   Please see the history of present illness.     All other systems reviewed and are negative.  EKGs/Labs/Other Studies Reviewed:    The following studies were reviewed today:   EKG:  EKG is  ordered today.  The ekg ordered today demonstrates   09/14/2022- sinus bradycardia 57 bpm  Recent Labs: 09/09/2022: BUN 15; Creatinine, Ser 0.99; Hemoglobin 11.9; Platelets 199; Potassium 3.2; Sodium 140   Recent Lipid Panel No results found for: "CHOL", "TRIG", "HDL", "CHOLHDL", "VLDL", "LDLCALC", "LDLDIRECT"   Risk Assessment/Calculations:     Physical Exam:    VS:   Vitals:   09/14/22 1019  BP: 134/83  Pulse: (!) 57  SpO2: 99%     Wt Readings from  Last 3 Encounters:  09/14/22 191 lb (86.6 kg)  11/30/11 164 lb (74.4 kg)  11/09/11 165 lb (74.8 kg)     GEN:  Well nourished, well developed in no acute distress HEENT: Normal NECK: No JVD; No carotid bruits LYMPHATICS: No lymphadenopathy CARDIAC: RRR, no murmurs, rubs, gallops RESPIRATORY:  Clear to auscultation without rales, wheezing or rhonchi  ABDOMEN: Soft, non-tender, non-distended MUSCULOSKELETAL:  No edema; No deformity  SKIN: Warm and dry NEUROLOGIC:  Alert and oriented x 3 PSYCHIATRIC:  Normal affect   ASSESSMENT:    Palpitations: She does not have high risk features including syncope c/f arrhythmia , family hx of SCD, or abnormalities on her EKG. Recommended reducing caffeine. Stress is contributing. Provided reassurance, no alarming features.  PLAN:    In order of problems  listed above:  14 day ziopatch Follow up pending results           Medication Adjustments/Labs and Tests Ordered: Current medicines are reviewed at length with the patient today.  Concerns regarding medicines are outlined above.  Orders Placed This Encounter  Procedures   LONG TERM MONITOR (3-14 DAYS)   EKG 12-Lead   No orders of the defined types were placed in this encounter.   Patient Instructions  Medication Instructions:  No Changes In Medications at this time.  *If you need a refill on your cardiac medications before your next appointment, please call your pharmacy*  Lab Work: None Ordered At This Time.  If you have labs (blood work) drawn today and your tests are completely normal, you will receive your results only by: [place] (if you have MyChart) OR A paper copy in the mail If you have any lab test that is abnormal or we need to change your treatment, we will call you to review the results.  Testing/Procedures:  Bryn Gulling- Long Term Monitor Instructions   Your physician has requested you wear your ZIO patch monitor__14_____days.   This is a single patch monitor.  [employer] supplies one patch monitor per enrollment.  Additional stickers are not available.   Please do not apply patch if you will be having a Nuclear Stress Test, Echocardiogram, Cardiac CT, MRI, or Chest Xray during the time frame you would be wearing the monitor. The patch cannot be worn during these tests.  You cannot remove and re-apply the ZIO XT patch monitor.   Your ZIO patch monitor will be sent USPS Priority mail from [facility] directly to your home address. The monitor may also be mailed to a PO BOX if home delivery is not available.   It may take 3-5 days to receive your monitor after you have been enrolled.   Once you have received you monitor, please review enclosed instructions.  Your monitor has already been registered assigning a specific monitor serial # to you.    Applying the monitor   Shave hair from upper left chest.   Hold abrader disc by orange tab.  Rub abrader in 40 strokes over left upper chest as indicated in your monitor instructions.   Clean area with 4 enclosed alcohol pads .  Use all pads to assure are is cleaned thoroughly.  Let dry.   Apply patch as indicated in monitor instructions.  Patch will be place under collarbone on left side of chest with arrow pointing upward.   Rub patch adhesive wings for 2 minutes.Remove white label marked "1".  Remove white label marked "2".  Rub patch adhesive wings for 2 additional minutes.  While looking in a mirror, press and release button in center of patch.  A small green light will flash 3-4 times .  This will be your only indicator the monitor has been turned on.     Do not shower for the first 24 hours.  You may shower after the first 24 hours.   Press button if you feel a symptom. You will hear a small click.  Record Date, Time and Symptom in the Patient Log Book.   When you are ready to remove patch, follow instructions on last 2 pages of Patient Log Book.  Stick patch monitor onto last page of Patient Log Book.   Place Patient Log Book in [place] box.  Use locking tab on box and tape box closed securely.  The Orange and [employer] has [employer] on it.  Please place in mailbox as soon as possible.  Your physician should have your test results approximately 7 days after the monitor has been mailed back to [facility].   Call [place] at [phone] if you have questions regarding your ZIO XT patch monitor.  Call them immediately if you see an orange light blinking on your monitor.   If your monitor falls off in less than 4 days contact our Monitor department at [phone].  If your monitor becomes loose or falls off after 4 days call [employer] at [phone] for suggestions on securing your monitor.   Follow-Up: At [facility], you and your health  needs are our priority.  As part of our continuing mission to provide you with exceptional heart care, we have created designated Provider Care Teams.  These Care Teams include your primary Cardiologist (physician) and Advanced Practice Providers (APPs -  Physician Assistants and Nurse Practitioners) who all work together to provide you with the care you need, when you need it.  We recommend signing up for the patient portal called "MyChart".  Sign up information is provided on this After Visit Summary.  MyChart is used to connect with patients for Virtual Visits (Telemedicine).  Patients are able to view lab/test results, encounter notes, upcoming appointments, etc.  Non-urgent messages can be sent to your provider as well.   To learn more about what you can do with MyChart, go to [email or web address].    Your next appointment:   AS NEEDED   The format for your next appointment:   In Person  Provider:   Janina Mayo, MD             Signed, Janina Mayo, MD  09/14/2022 10:37 AM    [place]

## 2022-09-17 DIAGNOSIS — R002 Palpitations: Secondary | ICD-10-CM | POA: Diagnosis not present

## 2022-09-23 ENCOUNTER — Other Ambulatory Visit (HOSPITAL_COMMUNITY): Payer: Self-pay

## 2022-09-23 MED ORDER — TRAVOPROST (BAK FREE) 0.004 % OP SOLN
1.0000 [drp] | Freq: Every evening | OPHTHALMIC | 1 refills | Status: DC
  Filled 2022-09-23 – 2023-01-03 (×2): qty 5, 50d supply, fill #0
  Filled 2023-03-29: qty 5, 50d supply, fill #1

## 2022-09-23 MED ORDER — TIMOLOL MALEATE 0.5 % OP SOLN
1.0000 [drp] | Freq: Two times a day (BID) | OPHTHALMIC | 0 refills | Status: DC
  Filled 2022-09-23: qty 15, 75d supply, fill #0

## 2022-10-11 DIAGNOSIS — E876 Hypokalemia: Secondary | ICD-10-CM | POA: Diagnosis not present

## 2022-10-11 DIAGNOSIS — F4312 Post-traumatic stress disorder, chronic: Secondary | ICD-10-CM | POA: Diagnosis not present

## 2022-10-11 DIAGNOSIS — E782 Mixed hyperlipidemia: Secondary | ICD-10-CM | POA: Diagnosis not present

## 2022-10-11 DIAGNOSIS — I10 Essential (primary) hypertension: Secondary | ICD-10-CM | POA: Diagnosis not present

## 2022-10-11 DIAGNOSIS — E785 Hyperlipidemia, unspecified: Secondary | ICD-10-CM | POA: Diagnosis not present

## 2022-10-11 DIAGNOSIS — Z6831 Body mass index (BMI) 31.0-31.9, adult: Secondary | ICD-10-CM | POA: Diagnosis not present

## 2022-10-11 DIAGNOSIS — R7303 Prediabetes: Secondary | ICD-10-CM | POA: Diagnosis not present

## 2022-11-06 ENCOUNTER — Other Ambulatory Visit (HOSPITAL_COMMUNITY): Payer: Self-pay

## 2022-12-10 ENCOUNTER — Encounter (HOSPITAL_COMMUNITY): Payer: Self-pay

## 2022-12-10 ENCOUNTER — Emergency Department (HOSPITAL_BASED_OUTPATIENT_CLINIC_OR_DEPARTMENT_OTHER): Payer: Commercial Managed Care - PPO

## 2022-12-10 ENCOUNTER — Ambulatory Visit (HOSPITAL_COMMUNITY)
Admission: EM | Admit: 2022-12-10 | Discharge: 2022-12-10 | Disposition: A | Discharge status: Home / Self Care | Payer: Commercial Managed Care - PPO

## 2022-12-10 ENCOUNTER — Emergency Department (HOSPITAL_COMMUNITY)
Admission: EM | Admit: 2022-12-10 | Discharge: 2022-12-10 | Disposition: A | Discharge status: Home / Self Care | Payer: Commercial Managed Care - PPO | Attending: Emergency Medicine | Admitting: Emergency Medicine

## 2022-12-10 ENCOUNTER — Other Ambulatory Visit: Payer: Self-pay

## 2022-12-10 ENCOUNTER — Encounter (HOSPITAL_COMMUNITY): Payer: Commercial Managed Care - PPO

## 2022-12-10 DIAGNOSIS — Z79899 Other long term (current) drug therapy: Secondary | ICD-10-CM | POA: Diagnosis not present

## 2022-12-10 DIAGNOSIS — M79661 Pain in right lower leg: Secondary | ICD-10-CM

## 2022-12-10 DIAGNOSIS — R52 Pain, unspecified: Secondary | ICD-10-CM

## 2022-12-10 DIAGNOSIS — I10 Essential (primary) hypertension: Secondary | ICD-10-CM | POA: Diagnosis not present

## 2022-12-10 LAB — BASIC METABOLIC PANEL
Anion gap: 11 (ref 5–15)
BUN: 6 mg/dL — ABNORMAL LOW (ref 8–23)
CO2: 27 mmol/L (ref 22–32)
Calcium: 9.4 mg/dL (ref 8.9–10.3)
Chloride: 101 mmol/L (ref 98–111)
Creatinine, Ser: 0.9 mg/dL (ref 0.44–1.00)
GFR, Estimated: >60 mL/min (ref >60)
Glucose, Bld: 107 mg/dL — ABNORMAL HIGH (ref 70–99)
Potassium: 3.5 mmol/L (ref 3.5–5.1)
Sodium: 139 mmol/L (ref 135–145)

## 2022-12-10 LAB — CBC
HCT: 33 % — ABNORMAL LOW (ref 36.0–46.0)
Hemoglobin: 11.4 g/dL — ABNORMAL LOW (ref 12.0–15.0)
MCH: 31.6 pg (ref 26.0–34.0)
MCHC: 34.5 g/dL (ref 30.0–36.0)
MCV: 91.4 fL (ref 80.0–100.0)
Platelets: 175 10*3/uL (ref 150–400)
RBC: 3.61 MIL/uL — ABNORMAL LOW (ref 3.87–5.11)
RDW: 13.4 % (ref 11.5–15.5)
WBC: 4.6 10*3/uL (ref 4.0–10.5)
nRBC: 0 % (ref 0.0–0.2)

## 2022-12-10 LAB — D-DIMER, QUANTITATIVE: D-Dimer, Quant: 0.54 ug/mL-FEU — ABNORMAL HIGH (ref 0.00–0.50)

## 2022-12-10 NOTE — ED Triage Notes (Addendum)
Right leg pain. Just got back from a flight from the [place]. Onset 4 days ago pain started. No falls injuries that she knows of. No swelling or bruises. Patient is concerned for blood clots with her frequent flying.

## 2022-12-10 NOTE — Discharge Instructions (Signed)
Return to the ED with any new or worsening signs or symptoms such as chest pain or shortness of breath Please follow-up with your PCP for reevaluation of right calf pain You may continue taking ibuprofen or Tylenol every 6 hours as needed for pain in the right calf

## 2022-12-10 NOTE — Progress Notes (Signed)
Right lower extremity venous study completed.   Preliminary results relayed to PA and RN.  Please see CV Procedures for preliminary results.  Kreg Earhart, RVT  12:32 PM 12/10/22

## 2022-12-10 NOTE — ED Notes (Signed)
Patient is being discharged from the Urgent Care and sent to the Emergency Department via POV . Per [place] NP, patient is in need of higher level of care due to possible DVT. Patient is aware and verbalizes understanding of plan of care.  Vitals:   12/10/22 0949  BP: (!) 152/78  Pulse: 65  Resp: 18  Temp: 98 F (36.7 C)  SpO2: 97%

## 2022-12-10 NOTE — Discharge Instructions (Addendum)
You were seen and evaluated in the urgent care today, please proceed to the nearest emergency room for further evaluation of your right calf pain.  Our outpatient DVT clinic is closed today for Good Friday.

## 2022-12-10 NOTE — ED Provider Notes (Signed)
[place]    CSN: [id number] Arrival date & time: 12/10/22  0827      History   Chief Complaint Chief Complaint  Patient presents with   Leg Pain    HPI Megan Bates is a 62 y.o. female.   Patient presents to clinic for right calf pain.  She had a flight back from the [place] on March 20.  About 2 days after this flight she started to have right calf pain that is progressively gotten worse. Reports her right calf feels tight. Denies falls, trauma, swelling, warmth or erythema. Denies CP or SOB.  She denies any form of contraception or hormonal medications.  She is supposed to have another flight on Tuesday and does not want to fly unless she is sure she does not have a blood clot.  Denies history of DVT.  The history is provided by the patient.  Leg Pain Associated symptoms: no fatigue and no fever     Past Medical History:  Diagnosis Date   Goiter    Hypertension    Osteopenia    Sickle cell trait [facility])     Patient Active Problem List   Diagnosis Date Noted   Chest pain 10/28/2011    Past Surgical History:  Procedure Laterality Date   APPENDECTOMY  2000    OB History   No obstetric history on file.      Home Medications    Prior to Admission medications   Medication Sig Start Date End Date Taking? Authorizing Provider  amLODipine (NORVASC) 2.5 MG tablet Take 1 tablet (2.5 mg total) by mouth daily. 08/16/22  Yes   hydrochlorothiazide (HYDRODIURIL) 25 MG tablet Take 1 tablet by mouth once daily for blood pressure 03/22/22  Yes   potassium chloride SA (KLOR-CON M) 20 MEQ tablet Take 1 tablet (20 mEq total) by mouth 2 (two) times daily. 09/14/22  Yes   timolol (TIMOPTIC) 0.5 % ophthalmic solution Instill 1 drop into each eye twice daily. 09/03/22  Yes   Travoprost, BAK Free, (TRAVATAN Z) 0.004 % SOLN ophthalmic solution Place 1 drop into both eyes once daily (every evening). 09/23/22  Yes   hydrochlorothiazide (HYDRODIURIL) 25 MG tablet TAKE 1 TABLET BY  MOUTH ONCE DAILY FOR BLOOD PRESSURE 09/18/20 09/14/22  Lucianne Lei, MD  HYDROcodone-acetaminophen (NORCO/VICODIN) 5-325 MG tablet TAKE 1 TO 2 TABLETS EVERY 4 TO 6 HOURS AS NEEDED FOR PAIN. Patient not taking: Reported on 09/14/2022 12/10/21     ibuprofen (ADVIL,MOTRIN) 800 MG tablet Take 1 tablet (800 mg total) by mouth every 8 (eight) hours as needed for pain. Patient not taking: Reported on 09/14/2022 06/05/13   Muthersbaugh, Jarrett Soho, PA-C    Family History Family History  Problem Relation Age of Onset   Diabetes Mother    Pulmonary embolism Father        After traumatic injury and fracture    Social History Social History   Tobacco Use   Smoking status: Never  Substance Use Topics   Alcohol use: No   Drug use: No     Allergies   Patient has no known allergies.   Review of Systems Review of Systems  Constitutional:  Negative for fatigue and fever.  HENT:  Negative for sore throat.   Respiratory:  Negative for cough and shortness of breath.   Cardiovascular:  Negative for chest pain.  Gastrointestinal:  Negative for abdominal pain.  Genitourinary:  Negative for dysuria.  Musculoskeletal:  Positive for arthralgias.  Neurological:  Negative  for syncope and headaches.     Physical Exam Triage Vital Signs ED Triage Vitals [12/10/22 0949]  Enc Vitals Group     BP (!) 152/78     Pulse Rate 65     Resp 18     Temp 98 F (36.7 C)     Temp Source Oral     SpO2 97 %     Weight 192 lb (87.1 kg)     Height 5\' 6"  (1.676 m)     Head Circumference      Peak Flow      Pain Score      Pain Loc      Pain Edu?      Excl. in [place]?    No data found.  Updated Vital Signs BP (!) 152/78 (BP Location: Right Arm)   Pulse 65   Temp 98 F (36.7 C) (Oral)   Resp 18   Ht 5\' 6"  (1.676 m)   Wt 192 lb (87.1 kg)   SpO2 97%   BMI 30.99 kg/m   Visual Acuity Right Eye Distance:   Left Eye Distance:   Bilateral Distance:    Right Eye Near:   Left Eye Near:    Bilateral Near:      Physical Exam Vitals and nursing note reviewed.  Constitutional:      Appearance: Normal appearance.  HENT:     Head: Normocephalic and atraumatic.  Cardiovascular:     Rate and Rhythm: Normal rate and regular rhythm.  Pulmonary:     Effort: Pulmonary effort is normal. No respiratory distress.  Musculoskeletal:        General: Tenderness present. No swelling, deformity or signs of injury.     Right lower leg: No edema.     Left lower leg: No edema.  Skin:    General: Skin is warm and dry.  Neurological:     Mental Status: She is alert.      UC Treatments / Results  Labs (all labs ordered are listed, but only abnormal results are displayed) Labs Reviewed - No data to display  EKG   Radiology No results found.  Procedures Procedures (including critical care time)  Medications Ordered in UC Medications - No data to display  Initial Impression / Assessment and Plan / UC Course  I have reviewed the triage vital signs and the nursing notes.  Pertinent labs & imaging results that were available during my care of the patient were reviewed by me and considered in my medical decision making (see chart for details).  Vitals in triage reviewed, patient is hemodynamically stable.  Presents to clinic for right calf pain after prolonged immobilization from flight back from [place] on March 20.  Her risk factors include obesity, immobility, recent long-haul flight, and calf pain in 1 leg.  Our outpatient DVT clinic is closed today, advised to nearest emergency room for further evaluation.  Patient verbalized understanding, no questions at this time.    Final Clinical Impressions(s) / UC Diagnoses   Final diagnoses:  Right calf pain     Discharge Instructions      You were seen and evaluated in the urgent care today, please proceed to the nearest emergency room for further evaluation of your right calf pain.  Our outpatient DVT clinic is closed today for Good  Friday.     ED Prescriptions   None    PDMP not reviewed this encounter.   Betsaida [place], [place], [place] 12/10/22 1018

## 2022-12-10 NOTE — ED Triage Notes (Signed)
Pt came in via POV from UC d/t Rt calf pain 4 days ago. She has had long distance travels to the [place] recently & is worried she may have a blood clot. A/Ox4, rates her pain 6/10.

## 2022-12-10 NOTE — ED Provider Notes (Signed)
[facility] Provider Note   CSN: [id number] Arrival date & time: 12/10/22  1044     History  Chief Complaint  Patient presents with   Rt Calf Pain    IMYA SIROTA is a 62 y.o. female with medical history hypertension, sickle cell trait, osteopenia.  Patient presents to ED for evaluation of right calf pain.  Patient seen at urgent care prior to arrival here, unable to complete outpatient DVT ultrasound at urgent care due to DVT clinic being closed on Friday.  Patient redirected here for ultrasound of right lower extremity secondary to calf pain.  Per patient, she had a flight back from the [place] on March 20.  2 days after this flight patient started having right calf pain that is progressively worsened since this time.  Patient reports her right calf feels as if it is tight.  Patient denies any trauma, falls to account for this pain.  Patient denies swelling, warmth or redness to her calf.  Denies chest pain or shortness of breath.  Patient denies hormonal use, contraception.  Patient concerned that she has another flight on Tuesday and does not want [place] she is sure she does not have a DVT.  No history of DVTs.  HPI     Home Medications Prior to Admission medications   Medication Sig Start Date End Date Taking? Authorizing Provider  amLODipine (NORVASC) 2.5 MG tablet Take 1 tablet (2.5 mg total) by mouth daily. 08/16/22     hydrochlorothiazide (HYDRODIURIL) 25 MG tablet TAKE 1 TABLET BY MOUTH ONCE DAILY FOR BLOOD PRESSURE 09/18/20 09/14/22  Lucianne Lei, MD  hydrochlorothiazide (HYDRODIURIL) 25 MG tablet Take 1 tablet by mouth once daily for blood pressure 03/22/22     HYDROcodone-acetaminophen (NORCO/VICODIN) 5-325 MG tablet TAKE 1 TO 2 TABLETS EVERY 4 TO 6 HOURS AS NEEDED FOR PAIN. Patient not taking: Reported on 09/14/2022 12/10/21     ibuprofen (ADVIL,MOTRIN) 800 MG tablet Take 1 tablet (800 mg total) by mouth every 8 (eight) hours as needed for  pain. Patient not taking: Reported on 09/14/2022 06/05/13   Muthersbaugh, Jarrett Soho, PA-C  potassium chloride SA (KLOR-CON M) 20 MEQ tablet Take 1 tablet (20 mEq total) by mouth 2 (two) times daily. 09/14/22     timolol (TIMOPTIC) 0.5 % ophthalmic solution Instill 1 drop into each eye twice daily. 09/03/22     Travoprost, BAK Free, (TRAVATAN Z) 0.004 % SOLN ophthalmic solution Place 1 drop into both eyes once daily (every evening). 09/23/22         Allergies    Patient has no known allergies.    Review of Systems   Review of Systems  Musculoskeletal:  Positive for myalgias.  All other systems reviewed and are negative.   Physical Exam Updated Vital Signs BP 114/77 (BP Location: Right Arm)   Pulse 60   Temp 98.4 F (36.9 C) (Oral)   Resp 20   SpO2 97%  Physical Exam Vitals and nursing note reviewed.  Constitutional:      General: She is not in acute distress.    Appearance: She is well-developed.  HENT:     Head: Normocephalic and atraumatic.  Eyes:     Conjunctiva/sclera: Conjunctivae normal.  Cardiovascular:     Rate and Rhythm: Normal rate and regular rhythm.     Heart sounds: No murmur heard. Pulmonary:     Effort: Pulmonary effort is normal. No respiratory distress.     Breath sounds: Normal breath  sounds.  Abdominal:     Palpations: Abdomen is soft.     Tenderness: There is no abdominal tenderness.  Musculoskeletal:        General: Tenderness present. No swelling.     Cervical back: Neck supple.     Comments: Tenderness to right calf  Skin:    General: Skin is warm and dry.     Capillary Refill: Capillary refill takes less than 2 seconds.  Neurological:     Mental Status: She is alert.  Psychiatric:        Mood and Affect: Mood normal.     ED Results / Procedures / Treatments   Labs (all labs ordered are listed, but only abnormal results are displayed) Labs Reviewed  CBC - Abnormal; Notable for the following components:      Result Value   RBC 3.61 (*)     Hemoglobin 11.4 (*)    HCT 33.0 (*)    All other components within normal limits  BASIC METABOLIC PANEL - Abnormal; Notable for the following components:   Glucose, Bld 107 (*)    BUN 6 (*)    All other components within normal limits  D-DIMER, QUANTITATIVE - Abnormal; Notable for the following components:   D-Dimer, Quant 0.54 (*)    All other components within normal limits    EKG None  Radiology VAS [place] LOWER EXTREMITY VENOUS (DVT) (7a-7p)  Result Date: 12/10/2022  Lower Venous DVT Study Patient Name:  METTA PINGREE  Date of Exam:   12/10/2022 Medical Rec #: [id number]       Accession #:    [id number] Date of Birth: 1960-09-19       Patient Gender: F Patient Age:   62 years Exam Location:  [facility] Procedure:      VAS [place] LOWER EXTREMITY VENOUS (DVT) Referring Phys: Harrell Gave Nkosi Cortright --------------------------------------------------------------------------------  Indications: Pain in right lateral calf, Recent long flight.  Comparison Study: No previous study. Performing Technologist: McKayla Maag RVT, VT  Examination Guidelines: A complete evaluation includes B-mode imaging, spectral Doppler, color Doppler, and power Doppler as needed of all accessible portions of each vessel. Bilateral testing is considered an integral part of a complete examination. Limited examinations for reoccurring indications may be performed as noted. The reflux portion of the exam is performed with the patient in reverse Trendelenburg.  +---------+---------------+---------+-----------+----------+--------------+ RIGHT    CompressibilityPhasicitySpontaneityPropertiesThrombus Aging +---------+---------------+---------+-----------+----------+--------------+ CFV      Full           Yes      Yes                                 +---------+---------------+---------+-----------+----------+--------------+ SFJ      Full                                                         +---------+---------------+---------+-----------+----------+--------------+ FV Prox  Full                                                        +---------+---------------+---------+-----------+----------+--------------+ FV Mid   Full                                                        +---------+---------------+---------+-----------+----------+--------------+  FV DistalFull                                                        +---------+---------------+---------+-----------+----------+--------------+ PFV      Full                                                        +---------+---------------+---------+-----------+----------+--------------+ POP      Full           Yes      Yes                                 +---------+---------------+---------+-----------+----------+--------------+ PTV      Full                                                        +---------+---------------+---------+-----------+----------+--------------+ PERO     Full                                                        +---------+---------------+---------+-----------+----------+--------------+   +----+---------------+---------+-----------+----------+--------------+ LEFTCompressibilityPhasicitySpontaneityPropertiesThrombus Aging +----+---------------+---------+-----------+----------+--------------+ CFV Full           Yes      Yes                                 +----+---------------+---------+-----------+----------+--------------+ SFJ Full                                                        +----+---------------+---------+-----------+----------+--------------+     Summary: RIGHT: - There is no evidence of deep vein thrombosis in the lower extremity.  - No cystic structure found in the popliteal fossa.  LEFT: - No evidence of common femoral vein obstruction.  *See table(s) above for measurements and observations.    Preliminary     Procedures Procedures    Medications Ordered in ED Medications - No data to display  ED Course/ Medical Decision Making/ A&P  Medical Decision Making Amount and/or Complexity of Data Reviewed Labs: ordered.   62 year old female presents to the ED for evaluation.  Please see HPI for further details.  On examination the patient is afebrile and nontachycardic.  Lung sounds clear bilaterally, she is not hypoxic.  Abdomen soft and compressible throughout.  Neurological examination at baseline.  Patient has no overlying skin changes to right calf however does have tenderness.  There is a negative Homans' sign.   CBC without leukocytosis, hemoglobin of 11.4 which is baseline.  BMP with elevated glucose however no other electrolyte derangement, stable creatinine.  D-dimer  elevated at 0.54.  Unsure why this was ordered in triage.  Patient denies chest pain or shortness of breath.  Discussed this with attending Dr. Jeanell Sparrow who believes that without symptoms of shortness of other chest pain, stable vital signs we do not need to proceed with CT angiogram.  Ultrasound imaging of patient right lower extremity negative for DVT.  Patient advised of results.  Patient advised to take ibuprofen or Tylenol in the setting of calf pain.  Patient advised to follow-up with her PCP for further management.  Return precautions were provided and the patient voiced understanding.  All questions answered.  Patient stable to discharge home.  Final Clinical Impression(s) / ED Diagnoses Final diagnoses:  Right calf pain    Rx / DC Orders ED Discharge Orders     None         Azucena Cecil, PA-C 12/10/22 1316    Pattricia Boss, MD 12/10/22 [phone]

## 2023-01-02 ENCOUNTER — Other Ambulatory Visit (HOSPITAL_COMMUNITY): Payer: Self-pay

## 2023-01-03 ENCOUNTER — Other Ambulatory Visit (HOSPITAL_COMMUNITY): Payer: Self-pay

## 2023-02-01 DIAGNOSIS — F4312 Post-traumatic stress disorder, chronic: Secondary | ICD-10-CM | POA: Diagnosis not present

## 2023-02-01 DIAGNOSIS — R635 Abnormal weight gain: Secondary | ICD-10-CM | POA: Diagnosis not present

## 2023-02-01 DIAGNOSIS — I10 Essential (primary) hypertension: Secondary | ICD-10-CM | POA: Diagnosis not present

## 2023-02-01 DIAGNOSIS — M19019 Primary osteoarthritis, unspecified shoulder: Secondary | ICD-10-CM | POA: Diagnosis not present

## 2023-02-01 DIAGNOSIS — R7303 Prediabetes: Secondary | ICD-10-CM | POA: Diagnosis not present

## 2023-02-01 DIAGNOSIS — M19111 Post-traumatic osteoarthritis, right shoulder: Secondary | ICD-10-CM | POA: Diagnosis not present

## 2023-02-01 DIAGNOSIS — E785 Hyperlipidemia, unspecified: Secondary | ICD-10-CM | POA: Diagnosis not present

## 2023-02-01 DIAGNOSIS — Z6832 Body mass index (BMI) 32.0-32.9, adult: Secondary | ICD-10-CM | POA: Diagnosis not present

## 2023-02-02 ENCOUNTER — Other Ambulatory Visit (HOSPITAL_COMMUNITY): Payer: Self-pay

## 2023-02-02 ENCOUNTER — Other Ambulatory Visit: Payer: Self-pay

## 2023-02-02 MED ORDER — HYDROCHLOROTHIAZIDE 25 MG PO TABS
25.0000 mg | ORAL_TABLET | Freq: Every day | ORAL | 0 refills | Status: DC
  Filled 2023-02-02: qty 90, 90d supply, fill #0

## 2023-02-02 MED ORDER — DAPAGLIFLOZIN PROPANEDIOL 10 MG PO TABS
10.0000 mg | ORAL_TABLET | ORAL | 0 refills | Status: DC
  Filled 2023-02-02: qty 90, 90d supply, fill #0

## 2023-02-02 MED ORDER — AMLODIPINE BESYLATE 2.5 MG PO TABS
2.5000 mg | ORAL_TABLET | Freq: Every day | ORAL | 0 refills | Status: DC
  Filled 2023-02-02: qty 90, 90d supply, fill #0

## 2023-02-03 ENCOUNTER — Other Ambulatory Visit (HOSPITAL_COMMUNITY): Payer: Self-pay

## 2023-02-03 ENCOUNTER — Other Ambulatory Visit: Payer: Self-pay

## 2023-02-03 ENCOUNTER — Other Ambulatory Visit (HOSPITAL_BASED_OUTPATIENT_CLINIC_OR_DEPARTMENT_OTHER): Payer: Self-pay

## 2023-02-08 ENCOUNTER — Other Ambulatory Visit (HOSPITAL_COMMUNITY): Payer: Self-pay

## 2023-02-10 ENCOUNTER — Other Ambulatory Visit (HOSPITAL_COMMUNITY): Payer: Self-pay

## 2023-02-15 DIAGNOSIS — M6281 Muscle weakness (generalized): Secondary | ICD-10-CM | POA: Diagnosis not present

## 2023-02-15 DIAGNOSIS — M25511 Pain in right shoulder: Secondary | ICD-10-CM | POA: Diagnosis not present

## 2023-02-15 DIAGNOSIS — M25512 Pain in left shoulder: Secondary | ICD-10-CM | POA: Diagnosis not present

## 2023-02-22 DIAGNOSIS — M25512 Pain in left shoulder: Secondary | ICD-10-CM | POA: Diagnosis not present

## 2023-02-22 DIAGNOSIS — M25511 Pain in right shoulder: Secondary | ICD-10-CM | POA: Diagnosis not present

## 2023-02-22 DIAGNOSIS — M6281 Muscle weakness (generalized): Secondary | ICD-10-CM | POA: Diagnosis not present

## 2023-03-01 DIAGNOSIS — M25512 Pain in left shoulder: Secondary | ICD-10-CM | POA: Diagnosis not present

## 2023-03-01 DIAGNOSIS — M6281 Muscle weakness (generalized): Secondary | ICD-10-CM | POA: Diagnosis not present

## 2023-03-01 DIAGNOSIS — M25511 Pain in right shoulder: Secondary | ICD-10-CM | POA: Diagnosis not present

## 2023-03-03 DIAGNOSIS — M6281 Muscle weakness (generalized): Secondary | ICD-10-CM | POA: Diagnosis not present

## 2023-03-03 DIAGNOSIS — M25511 Pain in right shoulder: Secondary | ICD-10-CM | POA: Diagnosis not present

## 2023-03-03 DIAGNOSIS — M25512 Pain in left shoulder: Secondary | ICD-10-CM | POA: Diagnosis not present

## 2023-03-11 DIAGNOSIS — M25511 Pain in right shoulder: Secondary | ICD-10-CM | POA: Diagnosis not present

## 2023-03-11 DIAGNOSIS — M6281 Muscle weakness (generalized): Secondary | ICD-10-CM | POA: Diagnosis not present

## 2023-03-11 DIAGNOSIS — M25512 Pain in left shoulder: Secondary | ICD-10-CM | POA: Diagnosis not present

## 2023-03-23 DIAGNOSIS — I10 Essential (primary) hypertension: Secondary | ICD-10-CM | POA: Diagnosis not present

## 2023-03-23 DIAGNOSIS — R7303 Prediabetes: Secondary | ICD-10-CM | POA: Diagnosis not present

## 2023-03-23 DIAGNOSIS — F418 Other specified anxiety disorders: Secondary | ICD-10-CM | POA: Diagnosis not present

## 2023-03-23 DIAGNOSIS — F32A Depression, unspecified: Secondary | ICD-10-CM | POA: Diagnosis not present

## 2023-03-23 DIAGNOSIS — F4312 Post-traumatic stress disorder, chronic: Secondary | ICD-10-CM | POA: Diagnosis not present

## 2023-03-23 DIAGNOSIS — E785 Hyperlipidemia, unspecified: Secondary | ICD-10-CM | POA: Diagnosis not present

## 2023-03-29 ENCOUNTER — Other Ambulatory Visit: Payer: Self-pay

## 2023-03-30 ENCOUNTER — Other Ambulatory Visit (HOSPITAL_COMMUNITY): Payer: Self-pay

## 2023-04-22 ENCOUNTER — Other Ambulatory Visit (HOSPITAL_COMMUNITY): Payer: Self-pay

## 2023-04-23 ENCOUNTER — Other Ambulatory Visit (HOSPITAL_COMMUNITY): Payer: Self-pay

## 2023-04-26 ENCOUNTER — Other Ambulatory Visit (HOSPITAL_COMMUNITY): Payer: Self-pay

## 2023-04-28 ENCOUNTER — Other Ambulatory Visit (HOSPITAL_COMMUNITY): Payer: Self-pay

## 2023-05-08 ENCOUNTER — Other Ambulatory Visit (HOSPITAL_COMMUNITY): Payer: Self-pay

## 2023-05-09 ENCOUNTER — Other Ambulatory Visit (HOSPITAL_COMMUNITY): Payer: Self-pay

## 2023-05-10 ENCOUNTER — Other Ambulatory Visit (HOSPITAL_COMMUNITY): Payer: Self-pay

## 2023-05-10 MED ORDER — HYDROCHLOROTHIAZIDE 25 MG PO TABS
25.0000 mg | ORAL_TABLET | Freq: Every day | ORAL | 0 refills | Status: DC
  Filled 2023-05-10: qty 90, 90d supply, fill #0

## 2023-05-10 MED ORDER — AMLODIPINE BESYLATE 2.5 MG PO TABS
2.5000 mg | ORAL_TABLET | Freq: Every day | ORAL | 0 refills | Status: DC
  Filled 2023-05-10: qty 90, 90d supply, fill #0

## 2023-05-11 ENCOUNTER — Other Ambulatory Visit (HOSPITAL_COMMUNITY): Payer: Self-pay

## 2023-05-13 DIAGNOSIS — R7303 Prediabetes: Secondary | ICD-10-CM | POA: Diagnosis not present

## 2023-05-13 DIAGNOSIS — E559 Vitamin D deficiency, unspecified: Secondary | ICD-10-CM | POA: Diagnosis not present

## 2023-05-13 DIAGNOSIS — E782 Mixed hyperlipidemia: Secondary | ICD-10-CM | POA: Diagnosis not present

## 2023-05-13 DIAGNOSIS — I10 Essential (primary) hypertension: Secondary | ICD-10-CM | POA: Diagnosis not present

## 2023-05-14 ENCOUNTER — Other Ambulatory Visit: Payer: Self-pay | Admitting: Family Medicine

## 2023-05-14 ENCOUNTER — Other Ambulatory Visit (HOSPITAL_COMMUNITY)
Admission: RE | Admit: 2023-05-14 | Discharge: 2023-05-14 | Disposition: A | Discharge status: Home / Self Care | Payer: Commercial Managed Care - PPO | Source: Ambulatory Visit | Attending: Family Medicine | Admitting: Family Medicine

## 2023-05-14 DIAGNOSIS — Z0000 Encounter for general adult medical examination without abnormal findings: Secondary | ICD-10-CM | POA: Insufficient documentation

## 2023-05-14 DIAGNOSIS — Z01419 Encounter for gynecological examination (general) (routine) without abnormal findings: Secondary | ICD-10-CM | POA: Insufficient documentation

## 2023-05-14 DIAGNOSIS — Z0001 Encounter for general adult medical examination with abnormal findings: Secondary | ICD-10-CM | POA: Diagnosis not present

## 2023-05-14 DIAGNOSIS — Z113 Encounter for screening for infections with a predominantly sexual mode of transmission: Secondary | ICD-10-CM | POA: Insufficient documentation

## 2023-05-18 ENCOUNTER — Other Ambulatory Visit (HOSPITAL_COMMUNITY): Payer: Self-pay

## 2023-05-19 ENCOUNTER — Other Ambulatory Visit (HOSPITAL_COMMUNITY): Payer: Self-pay

## 2023-05-19 LAB — MOLECULAR ANCILLARY ONLY
Bacterial Vaginitis (gardnerella): NEGATIVE
Candida Glabrata: NEGATIVE
Candida Vaginitis: NEGATIVE
Comment: NEGATIVE
Comment: NEGATIVE
Comment: NEGATIVE
Comment: NEGATIVE
Trichomonas: NEGATIVE

## 2023-05-19 LAB — CYTOLOGY - PAP
Adequacy: ABSENT
Diagnosis: NEGATIVE

## 2023-05-19 MED ORDER — ERGOCALCIFEROL 1.25 MG (50000 UT) PO CAPS
50000.0000 [IU] | ORAL_CAPSULE | ORAL | 0 refills | Status: AC
  Filled 2023-05-19: qty 12, 84d supply, fill #0

## 2023-05-19 MED ORDER — TIMOLOL MALEATE 0.5 % OP SOLN
1.0000 [drp] | Freq: Two times a day (BID) | OPHTHALMIC | 2 refills | Status: DC
  Filled 2023-05-19: qty 5, 25d supply, fill #0
  Filled 2023-06-25: qty 5, 25d supply, fill #1
  Filled 2023-07-15: qty 5, 25d supply, fill #2

## 2023-05-19 MED ORDER — SIMVASTATIN 10 MG PO TABS
10.0000 mg | ORAL_TABLET | Freq: Every day | ORAL | 0 refills | Status: DC
  Filled 2023-05-19: qty 90, 90d supply, fill #0

## 2023-05-19 MED ORDER — DAPAGLIFLOZIN PROPANEDIOL 10 MG PO TABS
10.0000 mg | ORAL_TABLET | Freq: Every morning | ORAL | 0 refills | Status: AC
  Filled 2023-05-19: qty 30, 30d supply, fill #0
  Filled 2023-06-25: qty 30, 30d supply, fill #1
  Filled 2023-07-20: qty 30, 30d supply, fill #2

## 2023-05-19 MED ORDER — TRAVOPROST (BAK FREE) 0.004 % OP SOLN
1.0000 [drp] | Freq: Every evening | OPHTHALMIC | 2 refills | Status: DC
  Filled 2023-05-19: qty 2.5, 25d supply, fill #0
  Filled 2023-06-24: qty 2.5, 25d supply, fill #1
  Filled 2023-06-24: qty 5, 50d supply, fill #1
  Filled 2023-06-24: qty 2.5, 25d supply, fill #1
  Filled 2023-06-24: qty 5, 50d supply, fill #1

## 2023-05-20 ENCOUNTER — Other Ambulatory Visit: Payer: Self-pay

## 2023-05-23 ENCOUNTER — Other Ambulatory Visit: Payer: Self-pay

## 2023-06-14 ENCOUNTER — Other Ambulatory Visit: Payer: Self-pay

## 2023-06-14 ENCOUNTER — Other Ambulatory Visit (HOSPITAL_COMMUNITY): Payer: Self-pay

## 2023-06-14 MED ORDER — AMOXICILLIN 500 MG PO CAPS
500.0000 mg | ORAL_CAPSULE | Freq: Three times a day (TID) | ORAL | 0 refills | Status: AC
  Filled 2023-06-14 – 2023-06-15 (×2): qty 30, 10d supply, fill #0

## 2023-06-15 ENCOUNTER — Other Ambulatory Visit (HOSPITAL_COMMUNITY): Payer: Self-pay

## 2023-06-15 ENCOUNTER — Other Ambulatory Visit: Payer: Self-pay

## 2023-06-18 DIAGNOSIS — Z1231 Encounter for screening mammogram for malignant neoplasm of breast: Secondary | ICD-10-CM | POA: Diagnosis not present

## 2023-06-20 ENCOUNTER — Encounter: Payer: Self-pay | Admitting: Internal Medicine

## 2023-06-20 ENCOUNTER — Ambulatory Visit: Payer: Commercial Managed Care - PPO | Attending: Internal Medicine | Admitting: Internal Medicine

## 2023-06-20 VITALS — BP 134/78 | HR 64 | Ht 66.0 in | Wt 190.8 lb

## 2023-06-20 DIAGNOSIS — R002 Palpitations: Secondary | ICD-10-CM | POA: Diagnosis not present

## 2023-06-20 NOTE — Progress Notes (Signed)
Cardiology Office Note:    Date:  06/20/2023   ID:  [name], DOB 07-24-1961, MRN [id number]  PCP:  Renaye Rakers, MD   [facility] Providers Cardiologist:  Maisie Fus, MD     Referring MD: Renaye Rakers, MD   No chief complaint on file. Palpitations   History of Present Illness:    Megan Bates is a 62 y.o. female with a hx of HTN, referral from the ED for palpitations.She noted while watching TV she felt heart racing up to the 150s. Notes stress is contributing. Had sinus rhythm with PACs in the ED.   She has no family hx of SCD. Blood pressure is good today . She saw Dr. Sanjuana Kava for chest pain in 2013. Had myoview in 2013 . It was normal. She had an echo as well and it was normal.  Interval Hx 06/20/2023 Patient had a ziopatch done with no arrhythmia. She requested a FU appointment. She was at a [employer] event. She reports feeling hot , noted everything blacked out and she had a presyncopal event.  Past Medical History:  Diagnosis Date   Goiter    Hypertension    Osteopenia    Sickle cell trait (HCC)     Past Surgical History:  Procedure Laterality Date   APPENDECTOMY  2000    Current Medications: No outpatient medications have been marked as taking for the 06/20/23 encounter (Appointment) with Maisie Fus, MD.     Allergies:   Patient has no known allergies.   Social History   Socioeconomic History   Marital status: Married    Spouse name: Not on file   Number of children: 4   Years of education: Not on file   Highest education level: Not on file  Occupational History   Occupation: Engineer, civil (consulting) at [employer] [address]    Employer: [place]  Tobacco Use   Smoking status: Never   Smokeless tobacco: Not on file  Substance and Sexual Activity   Alcohol use: No   Drug use: No   Sexual activity: Not on file  Other Topics Concern   Not on file  Social History Narrative   Married 4 children   Social Determinants of Health   Financial  Resource Strain: Not on file  Food Insecurity: Not on file  Transportation Needs: Not on file  Physical Activity: Not on file  Stress: Not on file  Social Connections: Not on file     Family History: The patient's family history includes Diabetes in her mother; Pulmonary embolism in her father. Has a son with cardiomyopathy seen at [facility].  ROS:   Please see the history of present illness.     All other systems reviewed and are negative.  EKGs/Labs/Other Studies Reviewed:    The following studies were reviewed today:   EKG:  EKG is  ordered today.  The ekg ordered today demonstrates   09/14/2022- sinus bradycardia 57 bpm  EKG Interpretation Date/Time:  Monday June 20 2023 10:27:34 EDT Ventricular Rate:  64 PR Interval:  172 QRS Duration:  76 QT Interval:  406 QTC Calculation: 418 R Axis:   69  Text Interpretation: Normal sinus rhythm Normal ECG When compared with ECG of 09-Sep-2022 22:10, Premature atrial complexes are no longer Present Vent. rate has decreased BY  34 BPM Nonspecific T wave abnormality no longer evident in Inferior leads Confirmed by Carolan Clines ([id number]) on 06/20/2023 10:33:09 AM   Ziopatch 19 triggered events. For sinus  rhythm, occasional SVE, rare ventricular ectopy. No significant tachyarrhythmia or bradyarrhythmia. No atrial fibrillation or flutter.   Recent Labs: 12/10/2022: BUN 6; Creatinine, Ser 0.90; Hemoglobin 11.4; Platelets 175; Potassium 3.5; Sodium 139   Recent Lipid Panel No results found for: "CHOL", "TRIG", "HDL", "CHOLHDL", "VLDL", "LDLCALC", "LDLDIRECT"   Risk Assessment/Calculations:     Physical Exam:    VS:   There were no vitals filed for this visit.    Wt Readings from Last 3 Encounters:  12/10/22 192 lb (87.1 kg)  09/14/22 191 lb (86.6 kg)  11/30/11 164 lb (74.4 kg)     GEN:  Well nourished, well developed in no acute distress HEENT: Normal NECK: No JVD; No carotid bruits LYMPHATICS: No lymphadenopathy CARDIAC:  RRR, no murmurs, rubs, gallops RESPIRATORY:  Clear to auscultation without rales, wheezing or rhonchi  ABDOMEN: Soft, non-tender, non-distended MUSCULOSKELETAL:  No edema; No deformity  SKIN: Warm and dry NEUROLOGIC:  Alert and oriented x 3 PSYCHIATRIC:  Normal affect   ASSESSMENT:    Palpitations: She had no concerning arrhythmia on her ziopatch.  Episode with presyncopal c/w vasovagal.  Recommend hydration and walking, stress/anxiety coping/management.  PLAN:    In order of problems listed above:  Follow up PRN           Medication Adjustments/Labs and Tests Ordered: Current medicines are reviewed at length with the patient today.  Concerns regarding medicines are outlined above.  No orders of the defined types were placed in this encounter.  No orders of the defined types were placed in this encounter.   There are no Patient Instructions on file for this visit.   Signed, Maisie Fus, MD  06/20/2023 9:08 AM    [facility]

## 2023-06-20 NOTE — Patient Instructions (Signed)
Medication Instructions:  No changes *If you need a refill on your cardiac medications before your next appointment, please call your pharmacy*  Follow-Up: At [facility], you and your health needs are our priority.  As part of our continuing mission to provide you with exceptional heart care, we have created designated Provider Care Teams.  These Care Teams include your primary Cardiologist (physician) and Advanced Practice Providers (APPs -  Physician Assistants and Nurse Practitioners) who all work together to provide you with the care you need, when you need it.  We recommend signing up for the patient portal called "MyChart".  Sign up information is provided on this After Visit Summary.  MyChart is used to connect with patients for Virtual Visits (Telemedicine).  Patients are able to view lab/test results, encounter notes, upcoming appointments, etc.  Non-urgent messages can be sent to your provider as well.   To learn more about what you can do with MyChart, go to [email or web address].    Your next appointment:    Follow up as needed  Provider:   Maisie Fus, MD

## 2023-06-24 ENCOUNTER — Other Ambulatory Visit (HOSPITAL_COMMUNITY): Payer: Self-pay

## 2023-06-25 ENCOUNTER — Other Ambulatory Visit (HOSPITAL_COMMUNITY): Payer: Self-pay

## 2023-06-25 MED ORDER — DAPAGLIFLOZIN PROPANEDIOL 10 MG PO TABS: 10.0000 mg | ORAL_TABLET | ORAL | 0 refills | Status: AC

## 2023-06-27 ENCOUNTER — Other Ambulatory Visit: Payer: Self-pay

## 2023-06-27 ENCOUNTER — Other Ambulatory Visit (HOSPITAL_COMMUNITY): Payer: Self-pay

## 2023-07-01 ENCOUNTER — Other Ambulatory Visit (HOSPITAL_COMMUNITY): Payer: Self-pay

## 2023-07-04 ENCOUNTER — Other Ambulatory Visit (HOSPITAL_COMMUNITY): Payer: Self-pay

## 2023-07-04 MED ORDER — AMOXICILLIN-POT CLAVULANATE 875-125 MG PO TABS
1.0000 | ORAL_TABLET | Freq: Two times a day (BID) | ORAL | 0 refills | Status: AC
  Filled 2023-07-04: qty 14, 7d supply, fill #0

## 2023-08-02 ENCOUNTER — Other Ambulatory Visit (HOSPITAL_COMMUNITY): Payer: Self-pay

## 2023-08-03 ENCOUNTER — Other Ambulatory Visit (HOSPITAL_COMMUNITY): Payer: Self-pay

## 2023-08-03 ENCOUNTER — Other Ambulatory Visit: Payer: Self-pay

## 2023-08-03 MED ORDER — HYDROCHLOROTHIAZIDE 25 MG PO TABS
25.0000 mg | ORAL_TABLET | Freq: Every day | ORAL | 0 refills | Status: DC
  Filled 2023-08-03: qty 30, 30d supply, fill #0

## 2023-08-03 MED ORDER — AMLODIPINE BESYLATE 2.5 MG PO TABS
2.5000 mg | ORAL_TABLET | Freq: Every day | ORAL | 0 refills | Status: DC
  Filled 2023-08-03: qty 30, 30d supply, fill #0

## 2023-08-06 ENCOUNTER — Other Ambulatory Visit (HOSPITAL_COMMUNITY): Payer: Self-pay

## 2023-08-09 ENCOUNTER — Other Ambulatory Visit (HOSPITAL_COMMUNITY): Payer: Self-pay

## 2023-08-11 ENCOUNTER — Other Ambulatory Visit (HOSPITAL_COMMUNITY): Payer: Self-pay

## 2023-08-12 ENCOUNTER — Other Ambulatory Visit (HOSPITAL_COMMUNITY): Payer: Self-pay

## 2023-08-18 ENCOUNTER — Other Ambulatory Visit (HOSPITAL_COMMUNITY): Payer: Self-pay

## 2023-08-18 MED ORDER — SIMVASTATIN 10 MG PO TABS
10.0000 mg | ORAL_TABLET | Freq: Every evening | ORAL | 0 refills | Status: DC
  Filled 2023-08-18: qty 90, 90d supply, fill #0

## 2023-08-19 ENCOUNTER — Other Ambulatory Visit (HOSPITAL_COMMUNITY): Payer: Self-pay

## 2023-08-22 ENCOUNTER — Other Ambulatory Visit (HOSPITAL_COMMUNITY): Payer: Self-pay

## 2023-08-22 ENCOUNTER — Other Ambulatory Visit: Payer: Self-pay

## 2023-08-22 MED ORDER — DAPAGLIFLOZIN PROPANEDIOL 10 MG PO TABS
10.0000 mg | ORAL_TABLET | Freq: Every morning | ORAL | 0 refills | Status: AC
  Filled 2023-08-22: qty 30, 30d supply, fill #0

## 2023-08-29 ENCOUNTER — Other Ambulatory Visit (HOSPITAL_COMMUNITY): Payer: Self-pay

## 2023-08-29 ENCOUNTER — Other Ambulatory Visit: Payer: Self-pay

## 2023-08-29 MED ORDER — TIMOLOL MALEATE 0.5 % OP SOLN
1.0000 [drp] | Freq: Two times a day (BID) | OPHTHALMIC | 3 refills | Status: AC
  Filled 2023-08-29: qty 5, 20d supply, fill #0
  Filled 2023-08-29: qty 5, 25d supply, fill #0
  Filled 2023-08-29: qty 5, 50d supply, fill #0
  Filled 2023-09-16 – 2023-10-03 (×2): qty 5, 25d supply, fill #1
  Filled 2023-10-22: qty 5, 25d supply, fill #2
  Filled 2023-11-19: qty 5, 25d supply, fill #3

## 2023-08-29 MED ORDER — TRAVOPROST (BAK FREE) 0.004 % OP SOLN
1.0000 [drp] | Freq: Every evening | OPHTHALMIC | 2 refills | Status: DC
  Filled 2023-08-29: qty 2.5, 25d supply, fill #0
  Filled 2023-08-29: qty 2.5, 50d supply, fill #0
  Filled 2023-08-29: qty 2.5, 25d supply, fill #0
  Filled 2023-09-16 – 2023-10-03 (×2): qty 2.5, 25d supply, fill #1
  Filled 2023-10-22: qty 2.5, 25d supply, fill #2

## 2023-09-01 ENCOUNTER — Other Ambulatory Visit (HOSPITAL_COMMUNITY): Payer: Self-pay

## 2023-09-03 ENCOUNTER — Other Ambulatory Visit (HOSPITAL_COMMUNITY): Payer: Self-pay

## 2023-09-03 MED ORDER — HYDROCHLOROTHIAZIDE 25 MG PO TABS
25.0000 mg | ORAL_TABLET | Freq: Every day | ORAL | 0 refills | Status: DC
  Filled 2023-09-03: qty 30, 30d supply, fill #0

## 2023-09-03 MED ORDER — AMLODIPINE BESYLATE 2.5 MG PO TABS
2.5000 mg | ORAL_TABLET | Freq: Every day | ORAL | 4 refills | Status: AC
  Filled 2023-09-03: qty 30, 30d supply, fill #0
  Filled 2023-10-01: qty 30, 30d supply, fill #1
  Filled 2023-10-29: qty 30, 30d supply, fill #2

## 2023-09-05 ENCOUNTER — Other Ambulatory Visit: Payer: Self-pay

## 2023-09-19 ENCOUNTER — Other Ambulatory Visit (HOSPITAL_COMMUNITY): Payer: Self-pay

## 2023-10-01 ENCOUNTER — Other Ambulatory Visit (HOSPITAL_COMMUNITY): Payer: Self-pay

## 2023-10-03 ENCOUNTER — Other Ambulatory Visit (HOSPITAL_COMMUNITY): Payer: Self-pay

## 2023-10-03 ENCOUNTER — Other Ambulatory Visit: Payer: Self-pay

## 2023-10-15 ENCOUNTER — Other Ambulatory Visit (HOSPITAL_COMMUNITY): Payer: Self-pay

## 2023-10-17 ENCOUNTER — Other Ambulatory Visit (HOSPITAL_COMMUNITY): Payer: Self-pay

## 2023-10-17 DIAGNOSIS — I10 Essential (primary) hypertension: Secondary | ICD-10-CM | POA: Diagnosis not present

## 2023-10-17 DIAGNOSIS — F418 Other specified anxiety disorders: Secondary | ICD-10-CM | POA: Diagnosis not present

## 2023-10-17 DIAGNOSIS — F4312 Post-traumatic stress disorder, chronic: Secondary | ICD-10-CM | POA: Diagnosis not present

## 2023-10-17 DIAGNOSIS — R7303 Prediabetes: Secondary | ICD-10-CM | POA: Diagnosis not present

## 2023-10-17 DIAGNOSIS — E785 Hyperlipidemia, unspecified: Secondary | ICD-10-CM | POA: Diagnosis not present

## 2023-10-17 MED ORDER — HYDROCHLOROTHIAZIDE 25 MG PO TABS
25.0000 mg | ORAL_TABLET | Freq: Every day | ORAL | 1 refills | Status: DC
  Filled 2023-10-17: qty 90, 90d supply, fill #0
  Filled 2024-01-09: qty 90, 90d supply, fill #1

## 2023-10-17 MED ORDER — VITAMIN D (ERGOCALCIFEROL) 50000 UNITS PO CAPS
1.0000 | ORAL_CAPSULE | ORAL | 3 refills | Status: AC
  Filled 2023-10-24: qty 12, 84d supply, fill #0
  Filled 2024-01-09 (×2): qty 12, 84d supply, fill #1
  Filled 2024-04-02 (×2): qty 12, 84d supply, fill #2
  Filled 2024-06-25 (×4): qty 12, 84d supply, fill #3

## 2023-10-17 MED ORDER — DAPAGLIFLOZIN PROPANEDIOL 10 MG PO TABS
10.0000 mg | ORAL_TABLET | Freq: Every morning | ORAL | 1 refills | Status: DC
  Filled 2023-10-17: qty 90, 90d supply, fill #0
  Filled 2024-01-09: qty 90, 90d supply, fill #1

## 2023-10-17 MED ORDER — AMLODIPINE BESYLATE 2.5 MG PO TABS
2.5000 mg | ORAL_TABLET | Freq: Every day | ORAL | 1 refills | Status: DC
  Filled 2023-10-17 – 2023-10-20 (×4): qty 90, 90d supply, fill #0
  Filled 2024-01-16: qty 90, 90d supply, fill #1

## 2023-10-18 ENCOUNTER — Other Ambulatory Visit: Payer: Self-pay

## 2023-10-19 ENCOUNTER — Other Ambulatory Visit: Payer: Self-pay

## 2023-10-20 ENCOUNTER — Other Ambulatory Visit (HOSPITAL_COMMUNITY): Payer: Self-pay

## 2023-10-24 ENCOUNTER — Other Ambulatory Visit (HOSPITAL_COMMUNITY): Payer: Self-pay

## 2023-10-28 ENCOUNTER — Other Ambulatory Visit (HOSPITAL_COMMUNITY): Payer: Self-pay

## 2023-10-30 ENCOUNTER — Other Ambulatory Visit (HOSPITAL_COMMUNITY): Payer: Self-pay

## 2023-10-31 ENCOUNTER — Other Ambulatory Visit: Payer: Self-pay

## 2023-11-07 ENCOUNTER — Other Ambulatory Visit (HOSPITAL_COMMUNITY): Payer: Self-pay

## 2023-11-07 DIAGNOSIS — H401133 Primary open-angle glaucoma, bilateral, severe stage: Secondary | ICD-10-CM | POA: Diagnosis not present

## 2023-11-07 DIAGNOSIS — H524 Presbyopia: Secondary | ICD-10-CM | POA: Diagnosis not present

## 2023-11-07 MED ORDER — TRAVOPROST (BAK FREE) 0.004 % OP SOLN
1.0000 [drp] | Freq: Every evening | OPHTHALMIC | 1 refills | Status: DC
  Filled 2023-11-07: qty 5, 90d supply, fill #0
  Filled 2023-11-08: qty 5, 50d supply, fill #0
  Filled 2024-01-03: qty 5, 50d supply, fill #1

## 2023-11-07 MED ORDER — TIMOLOL MALEATE 0.5 % OP SOLN
1.0000 [drp] | Freq: Two times a day (BID) | OPHTHALMIC | 1 refills | Status: DC
  Filled 2023-11-07: qty 15, 90d supply, fill #0
  Filled 2023-12-14: qty 15, 75d supply, fill #0
  Filled 2024-02-27: qty 15, 75d supply, fill #1

## 2023-11-08 ENCOUNTER — Other Ambulatory Visit: Payer: Self-pay

## 2023-11-08 ENCOUNTER — Other Ambulatory Visit (HOSPITAL_COMMUNITY): Payer: Self-pay

## 2023-11-09 ENCOUNTER — Other Ambulatory Visit: Payer: Self-pay

## 2023-11-09 ENCOUNTER — Other Ambulatory Visit (HOSPITAL_COMMUNITY): Payer: Self-pay

## 2023-11-09 MED ORDER — SIMVASTATIN 10 MG PO TABS
10.0000 mg | ORAL_TABLET | Freq: Every evening | ORAL | 0 refills | Status: AC
  Filled 2023-11-09: qty 90, 90d supply, fill #0

## 2023-11-19 ENCOUNTER — Other Ambulatory Visit (HOSPITAL_COMMUNITY): Payer: Self-pay

## 2023-11-22 ENCOUNTER — Other Ambulatory Visit (HOSPITAL_COMMUNITY): Payer: Self-pay

## 2023-12-14 ENCOUNTER — Other Ambulatory Visit: Payer: Self-pay

## 2023-12-14 ENCOUNTER — Other Ambulatory Visit (HOSPITAL_COMMUNITY): Payer: Self-pay

## 2024-01-09 ENCOUNTER — Other Ambulatory Visit (HOSPITAL_COMMUNITY): Payer: Self-pay

## 2024-01-17 DIAGNOSIS — I10 Essential (primary) hypertension: Secondary | ICD-10-CM | POA: Diagnosis not present

## 2024-01-17 DIAGNOSIS — E785 Hyperlipidemia, unspecified: Secondary | ICD-10-CM | POA: Diagnosis not present

## 2024-01-17 DIAGNOSIS — R7303 Prediabetes: Secondary | ICD-10-CM | POA: Diagnosis not present

## 2024-02-07 ENCOUNTER — Other Ambulatory Visit (HOSPITAL_COMMUNITY): Payer: Self-pay

## 2024-02-13 ENCOUNTER — Other Ambulatory Visit (HOSPITAL_COMMUNITY): Payer: Self-pay

## 2024-02-22 ENCOUNTER — Other Ambulatory Visit (HOSPITAL_COMMUNITY): Payer: Self-pay

## 2024-02-27 ENCOUNTER — Other Ambulatory Visit: Payer: Self-pay

## 2024-03-05 ENCOUNTER — Other Ambulatory Visit (HOSPITAL_COMMUNITY): Payer: Self-pay

## 2024-03-05 MED ORDER — TRAVOPROST (BAK FREE) 0.004 % OP SOLN
1.0000 [drp] | Freq: Every evening | OPHTHALMIC | 0 refills | Status: DC
  Filled 2024-03-05: qty 5, 50d supply, fill #0

## 2024-03-06 ENCOUNTER — Other Ambulatory Visit (HOSPITAL_COMMUNITY): Payer: Self-pay

## 2024-03-07 ENCOUNTER — Other Ambulatory Visit (HOSPITAL_COMMUNITY): Payer: Self-pay

## 2024-03-08 ENCOUNTER — Other Ambulatory Visit (HOSPITAL_COMMUNITY): Payer: Self-pay

## 2024-03-12 DIAGNOSIS — H401133 Primary open-angle glaucoma, bilateral, severe stage: Secondary | ICD-10-CM | POA: Diagnosis not present

## 2024-03-13 ENCOUNTER — Other Ambulatory Visit: Payer: Self-pay

## 2024-03-27 ENCOUNTER — Other Ambulatory Visit (HOSPITAL_COMMUNITY): Payer: Self-pay

## 2024-04-02 ENCOUNTER — Other Ambulatory Visit (HOSPITAL_COMMUNITY): Payer: Self-pay

## 2024-04-07 ENCOUNTER — Other Ambulatory Visit (HOSPITAL_COMMUNITY): Payer: Self-pay

## 2024-04-07 MED ORDER — DAPAGLIFLOZIN PROPANEDIOL 10 MG PO TABS
10.0000 mg | ORAL_TABLET | Freq: Every morning | ORAL | 0 refills | Status: DC
  Filled 2024-04-07: qty 90, 90d supply, fill #0

## 2024-04-07 MED ORDER — HYDROCHLOROTHIAZIDE 25 MG PO TABS
25.0000 mg | ORAL_TABLET | Freq: Every day | ORAL | 0 refills | Status: DC
  Filled 2024-04-07: qty 90, 90d supply, fill #0

## 2024-04-10 ENCOUNTER — Other Ambulatory Visit (HOSPITAL_COMMUNITY): Payer: Self-pay

## 2024-04-10 MED ORDER — TRAVOPROST (BAK FREE) 0.004 % OP SOLN
1.0000 [drp] | Freq: Every evening | OPHTHALMIC | 0 refills | Status: DC
  Filled 2024-04-11: qty 2.5, 50d supply, fill #0
  Filled 2024-04-12 – 2024-04-13 (×2): qty 5, 50d supply, fill #0
  Filled 2024-04-14: qty 5, 100d supply, fill #0
  Filled 2024-04-16: qty 5, 90d supply, fill #0
  Filled 2024-04-17 – 2024-04-19 (×3): qty 5, 100d supply, fill #0
  Filled 2024-04-20 – 2024-04-23 (×2): qty 5, 50d supply, fill #0

## 2024-04-10 MED ORDER — TIMOLOL MALEATE 0.5 % OP SOLN
1.0000 [drp] | Freq: Two times a day (BID) | OPHTHALMIC | 1 refills | Status: AC
  Filled 2024-04-10 – 2024-05-07 (×2): qty 15, 75d supply, fill #0
  Filled 2024-07-19: qty 15, 75d supply, fill #1

## 2024-04-11 ENCOUNTER — Other Ambulatory Visit (HOSPITAL_COMMUNITY): Payer: Self-pay

## 2024-04-11 ENCOUNTER — Other Ambulatory Visit: Payer: Self-pay

## 2024-04-12 ENCOUNTER — Other Ambulatory Visit: Payer: Self-pay

## 2024-04-12 ENCOUNTER — Other Ambulatory Visit (HOSPITAL_COMMUNITY): Payer: Self-pay

## 2024-04-13 ENCOUNTER — Other Ambulatory Visit: Payer: Self-pay

## 2024-04-14 ENCOUNTER — Other Ambulatory Visit (HOSPITAL_COMMUNITY): Payer: Self-pay

## 2024-04-16 ENCOUNTER — Other Ambulatory Visit: Payer: Self-pay

## 2024-04-16 ENCOUNTER — Other Ambulatory Visit (HOSPITAL_BASED_OUTPATIENT_CLINIC_OR_DEPARTMENT_OTHER): Payer: Self-pay

## 2024-04-16 ENCOUNTER — Other Ambulatory Visit (HOSPITAL_COMMUNITY): Payer: Self-pay

## 2024-04-16 MED ORDER — AMLODIPINE BESYLATE 2.5 MG PO TABS
2.5000 mg | ORAL_TABLET | Freq: Every day | ORAL | 0 refills | Status: DC
  Filled 2024-04-16 – 2024-04-17 (×4): qty 90, 90d supply, fill #0

## 2024-04-17 ENCOUNTER — Other Ambulatory Visit: Payer: Self-pay

## 2024-04-17 ENCOUNTER — Other Ambulatory Visit (HOSPITAL_COMMUNITY): Payer: Self-pay

## 2024-04-18 ENCOUNTER — Other Ambulatory Visit (HOSPITAL_COMMUNITY): Payer: Self-pay

## 2024-04-19 ENCOUNTER — Other Ambulatory Visit (HOSPITAL_COMMUNITY): Payer: Self-pay

## 2024-04-20 ENCOUNTER — Other Ambulatory Visit: Payer: Self-pay

## 2024-04-20 ENCOUNTER — Other Ambulatory Visit (HOSPITAL_COMMUNITY): Payer: Self-pay

## 2024-04-23 ENCOUNTER — Other Ambulatory Visit: Payer: Self-pay

## 2024-04-23 ENCOUNTER — Other Ambulatory Visit (HOSPITAL_COMMUNITY): Payer: Self-pay

## 2024-05-07 ENCOUNTER — Other Ambulatory Visit: Payer: Self-pay

## 2024-05-07 ENCOUNTER — Other Ambulatory Visit (HOSPITAL_COMMUNITY): Payer: Self-pay

## 2024-05-08 DIAGNOSIS — E782 Mixed hyperlipidemia: Secondary | ICD-10-CM | POA: Diagnosis not present

## 2024-05-08 DIAGNOSIS — I10 Essential (primary) hypertension: Secondary | ICD-10-CM | POA: Diagnosis not present

## 2024-05-08 DIAGNOSIS — Z0001 Encounter for general adult medical examination with abnormal findings: Secondary | ICD-10-CM | POA: Diagnosis not present

## 2024-05-08 DIAGNOSIS — H259 Unspecified age-related cataract: Secondary | ICD-10-CM | POA: Diagnosis not present

## 2024-06-05 ENCOUNTER — Other Ambulatory Visit (HOSPITAL_COMMUNITY): Payer: Self-pay

## 2024-06-07 ENCOUNTER — Other Ambulatory Visit (HOSPITAL_COMMUNITY): Payer: Self-pay

## 2024-06-07 ENCOUNTER — Other Ambulatory Visit: Payer: Self-pay

## 2024-06-07 MED ORDER — TRAVOPROST (BAK FREE) 0.004 % OP SOLN
1.0000 [drp] | Freq: Every evening | OPHTHALMIC | 1 refills | Status: AC
  Filled 2024-06-07 (×2): qty 5, 50d supply, fill #0
  Filled 2024-06-12: qty 5, 90d supply, fill #0
  Filled 2024-08-31: qty 5, 50d supply, fill #1

## 2024-06-08 ENCOUNTER — Other Ambulatory Visit: Payer: Self-pay

## 2024-06-11 ENCOUNTER — Other Ambulatory Visit (HOSPITAL_COMMUNITY): Payer: Self-pay

## 2024-06-12 ENCOUNTER — Other Ambulatory Visit: Payer: Self-pay

## 2024-06-12 ENCOUNTER — Other Ambulatory Visit (HOSPITAL_COMMUNITY): Payer: Self-pay

## 2024-06-13 ENCOUNTER — Other Ambulatory Visit: Payer: Self-pay

## 2024-06-14 ENCOUNTER — Other Ambulatory Visit (HOSPITAL_COMMUNITY): Payer: Self-pay

## 2024-06-14 ENCOUNTER — Other Ambulatory Visit: Payer: Self-pay

## 2024-06-23 ENCOUNTER — Other Ambulatory Visit (HOSPITAL_COMMUNITY): Payer: Self-pay

## 2024-06-25 ENCOUNTER — Other Ambulatory Visit (HOSPITAL_COMMUNITY): Payer: Self-pay

## 2024-06-25 MED ORDER — SIMVASTATIN 10 MG PO TABS
10.0000 mg | ORAL_TABLET | Freq: Every day | ORAL | 0 refills | Status: AC
  Filled 2024-06-25: qty 90, 90d supply, fill #0

## 2024-06-30 DIAGNOSIS — Z1231 Encounter for screening mammogram for malignant neoplasm of breast: Secondary | ICD-10-CM | POA: Diagnosis not present

## 2024-07-06 ENCOUNTER — Other Ambulatory Visit (HOSPITAL_COMMUNITY): Payer: Self-pay

## 2024-07-07 ENCOUNTER — Other Ambulatory Visit (HOSPITAL_COMMUNITY): Payer: Self-pay

## 2024-07-07 MED ORDER — DAPAGLIFLOZIN PROPANEDIOL 10 MG PO TABS
10.0000 mg | ORAL_TABLET | Freq: Every morning | ORAL | 0 refills | Status: DC
  Filled 2024-07-07: qty 90, 90d supply, fill #0

## 2024-07-07 MED ORDER — HYDROCHLOROTHIAZIDE 25 MG PO TABS
25.0000 mg | ORAL_TABLET | Freq: Every day | ORAL | 0 refills | Status: DC
  Filled 2024-07-07: qty 90, 90d supply, fill #0

## 2024-07-08 ENCOUNTER — Other Ambulatory Visit (HOSPITAL_COMMUNITY): Payer: Self-pay

## 2024-07-08 MED ORDER — AMLODIPINE BESYLATE 2.5 MG PO TABS
2.5000 mg | ORAL_TABLET | Freq: Every day | ORAL | 0 refills | Status: DC
  Filled 2024-07-08: qty 90, 90d supply, fill #0

## 2024-07-09 ENCOUNTER — Other Ambulatory Visit (HOSPITAL_COMMUNITY): Payer: Self-pay

## 2024-07-09 ENCOUNTER — Other Ambulatory Visit: Payer: Self-pay

## 2024-07-10 ENCOUNTER — Other Ambulatory Visit: Payer: Self-pay

## 2024-07-10 ENCOUNTER — Other Ambulatory Visit (HOSPITAL_COMMUNITY): Payer: Self-pay

## 2024-07-19 ENCOUNTER — Other Ambulatory Visit: Payer: Self-pay

## 2024-09-17 ENCOUNTER — Other Ambulatory Visit (HOSPITAL_COMMUNITY): Payer: Self-pay

## 2024-10-01 ENCOUNTER — Other Ambulatory Visit (HOSPITAL_COMMUNITY): Payer: Self-pay

## 2024-10-02 ENCOUNTER — Other Ambulatory Visit (HOSPITAL_COMMUNITY): Payer: Self-pay

## 2024-10-03 ENCOUNTER — Other Ambulatory Visit: Payer: Self-pay

## 2024-10-03 ENCOUNTER — Other Ambulatory Visit (HOSPITAL_COMMUNITY): Payer: Self-pay

## 2024-10-03 MED ORDER — HYDROCHLOROTHIAZIDE 25 MG PO TABS
25.0000 mg | ORAL_TABLET | Freq: Every day | ORAL | 0 refills | Status: AC
  Filled 2024-10-03: qty 90, 90d supply, fill #0

## 2024-10-03 MED ORDER — DAPAGLIFLOZIN PROPANEDIOL 10 MG PO TABS
10.0000 mg | ORAL_TABLET | Freq: Every morning | ORAL | 0 refills | Status: AC
  Filled 2024-10-03 (×2): qty 90, 90d supply, fill #0

## 2024-10-05 ENCOUNTER — Other Ambulatory Visit (HOSPITAL_COMMUNITY): Payer: Self-pay

## 2024-10-05 ENCOUNTER — Other Ambulatory Visit: Payer: Self-pay

## 2024-10-05 MED ORDER — SIMVASTATIN 10 MG PO TABS
10.0000 mg | ORAL_TABLET | Freq: Every day | ORAL | 0 refills | Status: AC
  Filled 2024-10-05: qty 90, 90d supply, fill #0

## 2024-10-06 ENCOUNTER — Other Ambulatory Visit (HOSPITAL_COMMUNITY): Payer: Self-pay

## 2024-10-08 ENCOUNTER — Other Ambulatory Visit (HOSPITAL_COMMUNITY): Payer: Self-pay

## 2024-10-08 ENCOUNTER — Other Ambulatory Visit: Payer: Self-pay

## 2024-10-08 MED ORDER — AMLODIPINE BESYLATE 2.5 MG PO TABS
2.5000 mg | ORAL_TABLET | Freq: Every day | ORAL | 0 refills | Status: AC
  Filled 2024-10-08: qty 90, 90d supply, fill #0

## 2024-10-11 ENCOUNTER — Other Ambulatory Visit: Payer: Self-pay
